# Patient Record
Sex: Male | Born: 1960 | Race: Black or African American | Hispanic: No | Marital: Married | Smoking: Current every day smoker
Health system: Southern US, Community
[De-identification: ages and names within clinical notes are randomized; demographics above are authoritative.]

## PROBLEM LIST (undated history)

## (undated) DIAGNOSIS — E0590 Thyrotoxicosis, unspecified without thyrotoxic crisis or storm: Secondary | ICD-10-CM

## (undated) DIAGNOSIS — F17200 Nicotine dependence, unspecified, uncomplicated: Secondary | ICD-10-CM

## (undated) DIAGNOSIS — Z5189 Encounter for other specified aftercare: Secondary | ICD-10-CM

## (undated) DIAGNOSIS — R5383 Other fatigue: Secondary | ICD-10-CM

## (undated) DIAGNOSIS — I10 Essential (primary) hypertension: Secondary | ICD-10-CM

## (undated) DIAGNOSIS — M549 Dorsalgia, unspecified: Secondary | ICD-10-CM

## (undated) DIAGNOSIS — R809 Proteinuria, unspecified: Secondary | ICD-10-CM

## (undated) DIAGNOSIS — N529 Male erectile dysfunction, unspecified: Secondary | ICD-10-CM

## (undated) DIAGNOSIS — E785 Hyperlipidemia, unspecified: Secondary | ICD-10-CM

## (undated) DIAGNOSIS — N200 Calculus of kidney: Secondary | ICD-10-CM

## (undated) DIAGNOSIS — E039 Hypothyroidism, unspecified: Secondary | ICD-10-CM

## (undated) DIAGNOSIS — R5381 Other malaise: Secondary | ICD-10-CM

## (undated) DIAGNOSIS — E0500 Thyrotoxicosis with diffuse goiter without thyrotoxic crisis or storm: Secondary | ICD-10-CM

## (undated) HISTORY — DX: Dorsalgia, unspecified: M54.9

## (undated) HISTORY — DX: Encounter for other specified aftercare: Z51.89

## (undated) HISTORY — DX: Thyrotoxicosis with diffuse goiter without thyrotoxic crisis or storm: E05.00

## (undated) HISTORY — DX: Proteinuria, unspecified: R80.9

## (undated) HISTORY — DX: Essential (primary) hypertension: I10

## (undated) HISTORY — DX: Male erectile dysfunction, unspecified: N52.9

## (undated) HISTORY — DX: Nicotine dependence, unspecified, uncomplicated: F17.200

## (undated) HISTORY — PX: OTHER SURGICAL HISTORY: SHX169

## (undated) HISTORY — DX: Other fatigue: R53.83

## (undated) HISTORY — DX: Thyrotoxicosis, unspecified without thyrotoxic crisis or storm: E05.90

## (undated) HISTORY — DX: Hypothyroidism, unspecified: E03.9

## (undated) HISTORY — DX: Other malaise: R53.81

## (undated) HISTORY — DX: Calculus of kidney: N20.0

## (undated) HISTORY — DX: Hyperlipidemia, unspecified: E78.5

---

## 1991-04-25 DIAGNOSIS — Z5189 Encounter for other specified aftercare: Secondary | ICD-10-CM

## 1991-04-25 HISTORY — PX: SMALL INTESTINE SURGERY: SHX150

## 1991-04-25 HISTORY — PX: AV FISTULA REPAIR: SHX563

## 1991-04-25 HISTORY — PX: HEMORRHOID SURGERY: SHX153

## 1991-04-25 HISTORY — DX: Encounter for other specified aftercare: Z51.89

## 1992-08-20 LAB — HM COLONOSCOPY: HM Colonoscopy: NORMAL

## 1999-05-15 ENCOUNTER — Emergency Department (HOSPITAL_COMMUNITY): Admission: EM | Admit: 1999-05-15 | Discharge: 1999-05-15 | Payer: Self-pay | Admitting: Emergency Medicine

## 1999-05-16 ENCOUNTER — Encounter: Payer: Self-pay | Admitting: Emergency Medicine

## 1999-05-19 ENCOUNTER — Encounter: Payer: Self-pay | Admitting: Urology

## 1999-05-19 ENCOUNTER — Ambulatory Visit (HOSPITAL_COMMUNITY): Admission: RE | Admit: 1999-05-19 | Discharge: 1999-05-19 | Payer: Self-pay | Admitting: Urology

## 2003-12-10 ENCOUNTER — Ambulatory Visit (HOSPITAL_COMMUNITY): Admission: RE | Admit: 2003-12-10 | Discharge: 2003-12-10 | Payer: Self-pay | Admitting: Internal Medicine

## 2004-04-28 ENCOUNTER — Emergency Department (HOSPITAL_COMMUNITY): Admission: EM | Admit: 2004-04-28 | Discharge: 2004-04-29 | Payer: Self-pay | Admitting: Emergency Medicine

## 2004-05-09 ENCOUNTER — Emergency Department (HOSPITAL_COMMUNITY): Admission: EM | Admit: 2004-05-09 | Discharge: 2004-05-09 | Payer: Self-pay | Admitting: Emergency Medicine

## 2004-05-20 ENCOUNTER — Ambulatory Visit (HOSPITAL_BASED_OUTPATIENT_CLINIC_OR_DEPARTMENT_OTHER): Admission: RE | Admit: 2004-05-20 | Discharge: 2004-05-20 | Payer: Self-pay | Admitting: Urology

## 2004-05-20 ENCOUNTER — Ambulatory Visit (HOSPITAL_COMMUNITY): Admission: RE | Admit: 2004-05-20 | Discharge: 2004-05-20 | Payer: Self-pay | Admitting: Urology

## 2004-05-23 ENCOUNTER — Ambulatory Visit (HOSPITAL_COMMUNITY): Admission: RE | Admit: 2004-05-23 | Discharge: 2004-05-23 | Payer: Self-pay | Admitting: Urology

## 2008-04-22 ENCOUNTER — Emergency Department (HOSPITAL_COMMUNITY): Admission: EM | Admit: 2008-04-22 | Discharge: 2008-04-22 | Payer: Self-pay | Admitting: Emergency Medicine

## 2008-04-23 ENCOUNTER — Ambulatory Visit: Payer: Self-pay | Admitting: Family Medicine

## 2008-04-23 DIAGNOSIS — M549 Dorsalgia, unspecified: Secondary | ICD-10-CM

## 2008-04-23 DIAGNOSIS — F17200 Nicotine dependence, unspecified, uncomplicated: Secondary | ICD-10-CM

## 2008-04-23 DIAGNOSIS — I10 Essential (primary) hypertension: Secondary | ICD-10-CM

## 2008-04-23 HISTORY — DX: Essential (primary) hypertension: I10

## 2008-04-23 HISTORY — DX: Nicotine dependence, unspecified, uncomplicated: F17.200

## 2008-04-23 HISTORY — DX: Dorsalgia, unspecified: M54.9

## 2008-05-28 ENCOUNTER — Ambulatory Visit: Payer: Self-pay | Admitting: Family Medicine

## 2008-06-29 ENCOUNTER — Ambulatory Visit: Payer: Self-pay | Admitting: Family Medicine

## 2008-07-02 LAB — CONVERTED CEMR LAB
AST: 35 units/L (ref 0–37)
Albumin: 4.3 g/dL (ref 3.5–5.2)
Basophils Absolute: 0.1 10*3/uL (ref 0.0–0.1)
Bilirubin, Direct: 0.1 mg/dL (ref 0.0–0.3)
CO2: 26 meq/L (ref 19–32)
Cholesterol: 270 mg/dL (ref 0–200)
GFR calc Af Amer: 92 mL/min
Glucose, Bld: 70 mg/dL (ref 70–99)
HCT: 45.9 % (ref 39.0–52.0)
Hemoglobin: 15.4 g/dL (ref 13.0–17.0)
Lymphocytes Relative: 41.1 % (ref 12.0–46.0)
Monocytes Relative: 8.5 % (ref 3.0–12.0)
Neutro Abs: 2.7 10*3/uL (ref 1.4–7.7)
Neutrophils Relative %: 48.1 % (ref 43.0–77.0)
PSA: 0.69 ng/mL (ref 0.10–4.00)
Platelets: 167 10*3/uL (ref 150–400)
Potassium: 4.4 meq/L (ref 3.5–5.1)
RDW: 14 % (ref 11.5–14.6)
Total CHOL/HDL Ratio: 3.6
Triglycerides: 73 mg/dL (ref 0–149)

## 2008-07-03 ENCOUNTER — Ambulatory Visit: Payer: Self-pay | Admitting: Family Medicine

## 2008-07-05 DIAGNOSIS — E785 Hyperlipidemia, unspecified: Secondary | ICD-10-CM

## 2008-07-05 HISTORY — DX: Hyperlipidemia, unspecified: E78.5

## 2009-01-15 ENCOUNTER — Ambulatory Visit: Payer: Self-pay | Admitting: Family Medicine

## 2009-01-17 LAB — CONVERTED CEMR LAB
Cholesterol: 230 mg/dL — ABNORMAL HIGH (ref 0–200)
Total CHOL/HDL Ratio: 3

## 2009-01-25 ENCOUNTER — Ambulatory Visit: Payer: Self-pay | Admitting: Family Medicine

## 2009-01-25 DIAGNOSIS — R809 Proteinuria, unspecified: Secondary | ICD-10-CM | POA: Insufficient documentation

## 2009-01-25 HISTORY — DX: Proteinuria, unspecified: R80.9

## 2009-01-27 LAB — CONVERTED CEMR LAB
ALT: 24 units/L (ref 0–53)
AST: 28 units/L (ref 0–37)
Albumin: 4.2 g/dL (ref 3.5–5.2)
BUN: 13 mg/dL (ref 6–23)
Bilirubin, Direct: 0.1 mg/dL (ref 0.0–0.3)
Chloride: 104 meq/L (ref 96–112)
Creatinine, Ser: 1.1 mg/dL (ref 0.4–1.5)
Creatinine,U: 227.1 mg/dL
GFR calc non Af Amer: 91.49 mL/min (ref >60)
Microalb Creat Ratio: 2.6 mg/g (ref 0.0–30.0)
Potassium: 3.7 meq/L (ref 3.5–5.1)
Total Bilirubin: 0.8 mg/dL (ref 0.3–1.2)
Total Protein: 7.5 g/dL (ref 6.0–8.3)

## 2009-06-29 ENCOUNTER — Ambulatory Visit: Payer: Self-pay | Admitting: Family Medicine

## 2009-06-29 DIAGNOSIS — R5381 Other malaise: Secondary | ICD-10-CM

## 2009-06-29 DIAGNOSIS — R5383 Other fatigue: Secondary | ICD-10-CM

## 2009-06-29 HISTORY — DX: Other malaise: R53.81

## 2009-06-29 LAB — CONVERTED CEMR LAB
AST: 29 units/L (ref 0–37)
Albumin: 3.9 g/dL (ref 3.5–5.2)
Bilirubin, Direct: 0.1 mg/dL (ref 0.0–0.3)
Calcium: 9.6 mg/dL (ref 8.4–10.5)
Cholesterol: 208 mg/dL — ABNORMAL HIGH (ref 0–200)
Creatinine, Ser: 0.9 mg/dL (ref 0.4–1.5)
Direct LDL: 125.9 mg/dL
GFR calc non Af Amer: 115.13 mL/min (ref >60)
HDL: 77.1 mg/dL (ref >39.00)
Lymphs Abs: 2.3 10*3/uL (ref 0.7–4.0)
MCHC: 32 g/dL (ref 30.0–36.0)
MCV: 85.4 fL (ref 78.0–100.0)
Neutrophils Relative %: 35.3 % — ABNORMAL LOW (ref 43.0–77.0)
RDW: 13.5 % (ref 11.5–14.6)
TSH: 0.03 microintl units/mL — ABNORMAL LOW (ref 0.35–5.50)
Total Bilirubin: 0.4 mg/dL (ref 0.3–1.2)
Total Protein: 7.8 g/dL (ref 6.0–8.3)
Triglycerides: 71 mg/dL (ref 0.0–149.0)
VLDL: 14.2 mg/dL (ref 0.0–40.0)
WBC: 4.8 10*3/uL (ref 4.5–10.5)

## 2009-07-27 ENCOUNTER — Ambulatory Visit: Payer: Self-pay | Admitting: Family Medicine

## 2009-07-27 DIAGNOSIS — E0590 Thyrotoxicosis, unspecified without thyrotoxic crisis or storm: Secondary | ICD-10-CM

## 2009-07-27 HISTORY — DX: Thyrotoxicosis, unspecified without thyrotoxic crisis or storm: E05.90

## 2009-07-28 DIAGNOSIS — E0500 Thyrotoxicosis with diffuse goiter without thyrotoxic crisis or storm: Secondary | ICD-10-CM | POA: Insufficient documentation

## 2009-07-28 HISTORY — DX: Thyrotoxicosis with diffuse goiter without thyrotoxic crisis or storm: E05.00

## 2009-07-29 LAB — CONVERTED CEMR LAB: T3, Free: 4.2 pg/mL (ref 2.3–4.2)

## 2009-08-23 ENCOUNTER — Ambulatory Visit: Payer: Self-pay | Admitting: Endocrinology

## 2010-02-09 ENCOUNTER — Telehealth (INDEPENDENT_AMBULATORY_CARE_PROVIDER_SITE_OTHER): Payer: Self-pay | Admitting: *Deleted

## 2010-03-21 ENCOUNTER — Ambulatory Visit: Payer: Self-pay | Admitting: Endocrinology

## 2010-03-21 LAB — CONVERTED CEMR LAB: TSH: 0.04 microintl units/mL — ABNORMAL LOW (ref 0.35–5.50)

## 2010-04-05 ENCOUNTER — Encounter (HOSPITAL_COMMUNITY)
Admission: RE | Admit: 2010-04-05 | Discharge: 2010-04-22 | Payer: Self-pay | Source: Home / Self Care | Attending: Endocrinology | Admitting: Endocrinology

## 2010-04-28 ENCOUNTER — Ambulatory Visit (HOSPITAL_COMMUNITY)
Admission: RE | Admit: 2010-04-28 | Discharge: 2010-04-28 | Payer: Self-pay | Source: Home / Self Care | Attending: Endocrinology | Admitting: Endocrinology

## 2010-05-24 NOTE — Progress Notes (Signed)
Summary: Endocrinlogist referral scheduled   Phone Note Call from Patient   Caller: Patient Call For: Hannah Beat MD Summary of Call: Called pt to discuss Endocrinlogist referral .  Cancelled the appt w/ Dr. Everardo All because wife was sick. Pt says he is feeling good and not sure if he needs to keep appt. Please advised.Marland KitchenMarland KitchenAram Beecham  Initial call taken by: Daine Gip,  February 09, 2010 3:46 PM  Follow-up for Phone Call        I still recommend Endocrine follow-up. His TSH was markedly low and has a daughter with Graves disease. That would be my recommendation.  Hannah Beat MD  February 11, 2010 9:14 AM   Additional Follow-up for Phone Call Additional follow up Details #1::        Called pt per Dr.Copland  left message.Daine Gip  February 25, 2010 10:19 AM     Additional Follow-up for Phone Call Additional follow up Details #2::    Pt appt w/ the Endocrinology has been scheduled for Nov 28th at 2:30pm. Pt aware of appt date and time, also he needs to arrive early.Daine Gip  March 15, 2010 10:20 AM  Follow-up by: Daine Gip,  March 15, 2010 10:20 AM  Additional Follow-up for Phone Call Additional follow up Details #3:: Details for Additional Follow-up Action Taken: Pt went to appt.Marland KitchenMarland KitchenDaine Gip  March 22, 2010 1:50 PM  Additional Follow-up by: Daine Gip,  March 22, 2010 1:50 PM

## 2010-05-24 NOTE — Assessment & Plan Note (Signed)
Summary: NEW [employer] -- GRAVES--ENDO PT--#-PER CYNTHIA/DR COPLAND-[facility]   Vital Signs:  Patient profile:   50 year old male Height:      72 inches (182.88 cm) Weight:      168.25 pounds (76.48 kg) BMI:     22.90 O2 Sat:      99 % on Room air Temp:     99.1 degrees F (37.28 degrees C) oral Pulse rate:   70 / minute Pulse rhythm:   regular BP sitting:   112 / 78  (left arm) Cuff size:   regular  Vitals Entered By: Brenton Grills CMA Duncan Dull) (March 21, 2010 2:48 PM)  O2 Flow:  Room air CC: New Endo Consult/Graves Disease/Dr. Copland Is Patient Diabetic? No   Referring Provider:  Hannah Beat MD Primary Provider:  Hannah Beat MD  CC:  New Endo Consult/Graves Disease/Dr. Patsy Lager.  History of Present Illness: pt was noted on routine labs to have suppressed tsh.  he says, in retrospect, he has few weeks of mild headaches, worst at the bifrontal areas, and associated slight weight loss.    Current Medications (verified): 1)  Hydrochlorothiazide 25 Mg  Tabs (Hydrochlorothiazide) .... Take 1 Tab By Mouth Every Morning 2)  Fish Oil   Oil (Fish Oil) .... Once Daily 3)  Multivitamins   Tabs (Multiple Vitamin) .... Take 1 Tablet By Mouth Once A Day 4)  Pravastatin Sodium 40 Mg Tabs (Pravastatin Sodium) .Marland Kitchen.. 1 By Mouth At Bedtime  Allergies (verified): No Known Drug Allergies  Past History:  Past Medical History: Last updated: 07/03/2008 HTN Hyperlipidemia Tobacco abuse  Family History: Reviewed history from 07/03/2008 and no changes required. dtr has grave's dz  Social History: Reviewed history from 07/03/2008 and no changes required. 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  he has slight doe.  he has slight acral tingling.    Physical Exam  General:  normal  appearance.   Head:  head: no deformity external nose and ears are normal mouth: no lesion seen Eyes:  there is bilat proptosis Neck:  thyroid is slightly enlarged on the right lobe, and normal on the left.  no nodule Lungs:  Clear to auscultation bilaterally. Normal respiratory effort.  Heart:  Regular rate and rhythm without murmurs or gallops noted. Normal S1,S2.   Abdomen:  abdomen is soft, nontender.  no hepatosplenomegaly.   not distended.  no hernia Msk:  muscle bulk and strength are grossly normal.  no obvious joint swelling.  gait is normal and steady  Pulses:  dorsalis pedis intact bilat.  no carotid bruit Extremities:  no deformity.  no ulcer on the feet.  feet are of normal color and temp.  no edema  Neurologic:  cn 2-12 grossly intact.   readily moves all 4's.   sensation is intact to touch on the feet  Skin:  normal texture and temp.  no rash.  not diaphoretic  Cervical Nodes:  No significant adenopathy.  Psych:  Alert and cooperative; normal mood and affect; normal attention span and concentration.   Additional Exam:  FastTSH              [L]  0.04 uIU/mL                 0.35-5.50 Free T4  1.60 ng/dL     Impression & Recommendations:  Problem # 1:  GRAVES' DISEASE (ICD-242.00) we discussed the cause (hereditary), adverse effects, and rx options  Problem # 2:  HYPERTHYROIDISM (ICD-242.90) due to #1  Problem # 3:  headache possibly due to #1  Other Orders: i-131 Thyroid uptake and scan (Capsule & Scan) Nuc Med Diagnostic (thyroid uptake & sca) TLB-TSH (Thyroid Stimulating Hormone) (84443-TSH) TLB-T4 (Thyrox), Free (44010-UV2Z) Consultation Level IV (36644)  Patient Instructions: 1)  blood tests are being ordered for you today.  please call [phone] to hear your test results. 2)  basad on the results, i would probably order a "scan" (a special, but easy and painless type of thyroid x ray) 3)  (update: i left message on phone-tree:  i ordered  scan)   Orders Added: 1)  i-131 Thyroid uptake and scan (Capsule & Scan) Nuc Med Diagnostic [thyroid uptake & sca] 2)  TLB-TSH (Thyroid Stimulating Hormone) [84443-TSH] 3)  TLB-T4 (Thyrox), Free [95638-VF6E] 4)  Consultation Level IV [33295]

## 2010-05-24 NOTE — Assessment & Plan Note (Signed)
Summary: CPX / LFW   Vital Signs:  Patient profile:   50 year old male Height:      72 inches Weight:      173.6 pounds BMI:     23.63 Temp:     98.1 degrees F oral Pulse rate:   72 / minute Pulse rhythm:   regular BP sitting:   122 / 90  (left arm) Cuff size:   regular  Vitals Entered By: Benny Lennert CMA Duncan Dull) (July 27, 2009 2:34 PM)  History of Present Illness: Chief complaint cpx  50 year old:  CPX  Low TSH, asymptomatic. Daughter with Graves disease. Does have prominent eyes. Denies any symptoms currently.  Preventive Screening-Counseling & Management  Alcohol-Tobacco     Alcohol drinks/day: <1     Alcohol type: brandy     >5/day in last 3 mos: yes     Alcohol Counseling: to decrease amount and/or frequency of alcohol intake     Smoking Status: current     Smoking Cessation Counseling: yes     Smoke Cessation Stage: contemplative     Packs/Day: 0.75     Pack years: 30     Tobacco Counseling: to quit use of tobacco products  Caffeine-Diet-Exercise     Diet Counseling: not indicated; diet is assessed to be healthy  Hep-HIV-STD-Contraception     STD Risk: no risk noted     Dental Care Counseling: not indicated; dental care within six months     Testicular SE Education/Counseling to perform regular STE      Sexual History:  currently monogamous.        Drug Use:  never.     Allergies (verified): No Known Drug Allergies  Past History:  Past medical, surgical, family and social histories (including risk factors) reviewed, and no changes noted (except as noted below).  Past Medical History: Reviewed history from 07/03/2008 and no changes required. HTN Hyperlipidemia Tobacco abuse  Past Surgical History: Reviewed history from 07/03/2008 and no changes required. remote bowel surgeries > 15 years ago   Family History: Reviewed history from 07/03/2008 and no changes required. n/c  Social History: Reviewed history from 07/03/2008 and no  changes required. 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  Cardiovascular: Denies chest pains, palpitations, syncope, dyspnea on exertion,  Respiratory: Denies cough, dyspnea at rest, excessive sputum,wheeezing GI: Denies nausea, vomiting, diarrhea, constipation, change in bowel habits, abdominal pain, melena, hematochezia GU: Denies dysuria, hematuria, discharge, urinary frequency, urinary hesitancy, nocturia, incontinence, genital sores, decreased libido Musculoskeletal: Denies back pain, joint pain Derm: Denies rash, itching Neuro: Denies  paresthesias, frequent falls, frequent headaches, and difficulty walking.  Psych: WIFE IN THE HOSPITAL WITH LIVER FAILURE Endocrine: Denies cold intolerance, heat intolerance, polydipsia, polyphagia, polyuria, and unusual weight change.  Heme: Denies enlarged lymph nodes Allergy: No hayfever   Otherwise, the pertinent positives and negatives are listed above and in the HPI, otherwise a full review of systems has been reviewed and is negative unless noted positive.    Impression & Recommendations:  Problem # 1:  HEALTH MAINTENANCE EXAM (ICD-V70.0) The patient's preventative maintenance and recommended screening tests for an annual wellness exam were reviewed in full today. Brought up to date unless services declined.  Counselled on the importance of diet, exercise, and its role in overall health and mortality. The patient's FH and SH was reviewed, including their home life, tobacco status, and drug and alcohol status.   Problem # 2:  HYPERTHYROIDISM (ICD-242.90) Assessment: New  TSH really low check active hormone levels  if quite high,  endocrine input may be helpful  Orders: Venipuncture (52841) TLB-T3, Free (Triiodothyronine) (84481-T3FREE) TLB-T4 (Thyrox), Total (84436-T4) TLB-T4 (Thyrox), Free [phone])  Labs Reviewed: TSH: 0.03 (06/29/2009)     Complete Medication List: 1)  Hydrochlorothiazide 25 Mg Tabs (Hydrochlorothiazide) .... Take 1 tab by mouth every morning 2)  Fish Oil Oil (Fish oil) .... Once daily 3)  Multivitamins Tabs (Multiple vitamin) .... Take 1 tablet by mouth once a day 4)  Pravastatin Sodium 40 Mg Tabs (Pravastatin sodium) .Marland Kitchen.. 1 by mouth at bedtime  Patient Instructions: 1)  CPX 1 year or if problems  Current Allergies (reviewed today): No known allergies      Other Screening   PSA: 0.72  (06/29/2009)   PSA action/deferral: Discussed-PSA requested  (07/27/2009)   PSA due due: 06/29/2009   Smoking status: current  (07/27/2009)   Smoking cessation counseling: yes  (07/27/2009)    Screening comments: declined DRE  Lipids   Total Cholesterol: 208  (06/29/2009)   LDL: DEL  (06/29/2008)   LDL Direct: 125.9  (06/29/2009)   HDL: 77.10  (06/29/2009)   Triglycerides: 71.0  (06/29/2009)    SGOT (AST): 29  (06/29/2009)   SGPT (ALT): 43  (06/29/2009)   Alkaline phosphatase: 63  (06/29/2009)   Total bilirubin: 0.4  (06/29/2009)    Lipid flowsheet reviewed?: Yes   Progress toward LDL goal: Improved  Hypertension   Last Blood Pressure: 122 / 90  (07/27/2009)   Serum creatinine: 0.9  (06/29/2009)   Serum potassium 4.1  (06/29/2009)    Hypertension flowsheet reviewed?: Yes   Progress toward BP goal: Unchanged   Physical Exam General Appearance: well developed, well nourished, no acute distress Eyes: conjunctiva and lids normal, PERRLA, EOMI Ears, Nose, Mouth, Throat: TM clear, nares clear, oral exam WNL Neck: supple, no lymphadenopathy, no thyromegaly, no JVD Respiratory: clear to auscultation and percussion, respiratory effort normal Cardiovascular: regular rate and  rhythm, S1-S2, no murmur, rub or gallop, no bruits, peripheral pulses normal and symmetric, no cyanosis, clubbing, edema or varicosities Chest: no scars, masses, tenderness; no asymmetry, skin changes, nipple discharge, no gynecomastia   Gastrointestinal: soft, non-tender; no hepatosplenomegaly, masses; active bowel sounds all quadrants,  Genitourinary: no hernia, testicular mass, penile discharge, priapism  Lymphatic: no cervical, axillary or inguinal adenopathy Musculoskeletal: gait normal, muscle tone and strength WNL, no joint swelling, effusions, discoloration, crepitus  Skin: clear, good turgor, color WNL, no rashes, lesions, or ulcerations Neurologic: normal mental status, normal reflexes, normal strength, sensation, and motion Psychiatric: alert; oriented to person, place and time Other Exam:

## 2010-06-03 ENCOUNTER — Ambulatory Visit: Payer: Self-pay | Admitting: Endocrinology

## 2010-06-24 ENCOUNTER — Ambulatory Visit (INDEPENDENT_AMBULATORY_CARE_PROVIDER_SITE_OTHER): Payer: BC Managed Care – PPO | Admitting: Endocrinology

## 2010-06-24 ENCOUNTER — Other Ambulatory Visit: Payer: BC Managed Care – PPO

## 2010-06-24 ENCOUNTER — Other Ambulatory Visit: Payer: Self-pay | Admitting: Endocrinology

## 2010-06-24 ENCOUNTER — Encounter: Payer: Self-pay | Admitting: Endocrinology

## 2010-06-24 DIAGNOSIS — E0590 Thyrotoxicosis, unspecified without thyrotoxic crisis or storm: Secondary | ICD-10-CM

## 2010-06-24 DIAGNOSIS — N529 Male erectile dysfunction, unspecified: Secondary | ICD-10-CM

## 2010-06-24 HISTORY — DX: Male erectile dysfunction, unspecified: N52.9

## 2010-06-24 LAB — TSH: TSH: 0.08 u[IU]/mL — ABNORMAL LOW (ref 0.35–5.50)

## 2010-06-24 LAB — T4, FREE: Free T4: 1.26 ng/dL (ref 0.60–1.60)

## 2010-07-05 NOTE — Assessment & Plan Note (Signed)
Summary: fu-lb   Vital Signs:  Patient profile:   50 year old male Height:      72 inches (182.88 cm) Weight:      176.38 pounds (80.17 kg) BMI:     24.01 O2 Sat:      97 % on Room air Temp:     99.1 degrees F (37.28 degrees C) oral Pulse rate:   88 / minute Pulse rhythm:   regular BP sitting:   128 / 88  (left arm) Cuff size:   regular  Vitals Entered By: Brenton Grills CMA Duncan Dull) (June 24, 2010 4:28 PM)  O2 Flow:  Room air CC: Follow-up on thyroid/aj Is Patient Diabetic? No   Referring Provider:  Hannah Beat MD Primary Provider:  Hannah Beat MD  CC:  Follow-up on thyroid/aj.  History of Present Illness: pt is now 2 mos s/o i-131 rx for hyperthyroidism due to grave's dz.   he report erectile dysfunction recently also.    Current Medications (verified): 1)  Hydrochlorothiazide 25 Mg  Tabs (Hydrochlorothiazide) .... Take 1 Tab By Mouth Every Morning 2)  Fish Oil   Oil (Fish Oil) .... Once Daily 3)  Multivitamins   Tabs (Multiple Vitamin) .... Take 1 Tablet By Mouth Once A Day 4)  Pravastatin Sodium 40 Mg Tabs (Pravastatin Sodium) .Marland Kitchen.. 1 By Mouth At Bedtime  Allergies (verified): No Known Drug Allergies  Past History:  Past Medical History: Last updated: 07/03/2008 HTN Hyperlipidemia Tobacco abuse  Past Surgical History: remote bowel surgeries > 15 years ago relocation of left undescended left testicle at age 50  Review of Systems       he has gained 8 lbs  Physical Exam  General:  normal appearance.   Neck:  thyroid is slightly enlarged on the right lobe, and normal on the left.  no nodule Genitalia:  both testes are small and soft.  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 1/2 tab as needed  Other Orders: TLB-TSH (Thyroid Stimulating Hormone) (84443-TSH) TLB-Testosterone, Total (84403-TESTO) TLB-T4 (Thyrox), Free [phone]) Est. Patient Level IV (46962) Est. Patient Level III (95284)  Patient Instructions: 1)  blood tests are being ordered for you today.  please call [phone] to hear your test results. 2)  Please schedule a follow-up appointment in 1 month. 3)  try levitra 1/2 of 10 mg as needed. Prescriptions: LEVITRA 20 MG TABS (VARDENAFIL HCL) 1/2 tab as needed  #2 x 5   Entered and Authorized by:   Minus Breeding MD   Signed by:  Minus Breeding MD on 06/24/2010   Method used:   [facility]       [address]       [place], [place]  [address]       Ph: [phone]       Fax: [phone]   RxID:   [id number]    Orders Added: 1)  TLB-TSH (Thyroid Stimulating Hormone) [84443-TSH] 2)  TLB-Testosterone, Total [84403-TESTO] 3)  TLB-T4 (Thyrox), Free [84132-GM0N] 4)  Est. Patient Level IV [02725] 5)  Est. Patient Level III [36644]

## 2010-09-09 NOTE — Op Note (Signed)
NAMEANSLEY, STANWOOD               ACCOUNT NO.:  [id number]   MEDICAL RECORD NO.:  [id number]          PATIENT TYPE:  AMB   LOCATION:  [facility]                         FACILITY:  [facility]   PHYSICIAN:  Rozanna Boer., M.D.DATE OF BIRTH:  Jun 10, 1960   DATE OF PROCEDURE:  05/20/2004  DATE OF DISCHARGE:                                 OPERATIVE REPORT   PREOPERATIVE DIAGNOSIS:  Left ureteral obstruction by 7 mm mid ureteral  stone.   POSTOPERATIVE DIAGNOSIS:  Left ureteral obstruction by 7 mm mid ureteral  stone.   OPERATIONS:  1.  Cysto.  2.  Retrograde pyelogram, stone manipulation with insertion of left ureteral      stent.   ANESTHESIA:  General.   SURGEON:  Dr. 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  He was seen in the  office 2 days ago, with a CT scan showing an obstructing 7 mm stone in the  upper to mid ureter on the left side with marked hydronephrosis.  I could  not see this well enough on x-ray to be able to do lithotripsy. so he came  back today having to take Percocet every 3-4 hours just to relieve the pain.  The stone was now visible on KUB and because of the need to require so much  in the way of pain medicine, he enters now for cysto, stone manipulation,  insertion of stent prior to anticipated lithotripsy on May 23, 2004.  He  had had lithotripsy twice before in the past.   The patient was placed on the operating table in dorsal lithotomy position.  After satisfactory induction of general anesthesia, was prepped and draped  in the usual sterile fashion.  Panendoscope was passed.  No anterior  stricture seen.  Posterior urethra was nonobstructing.  The bladder was  entered.  Bladder was free of any mucosal lesions.  Trigone and orifices  looked normal.  The left orifice was  catheterized with a floppy-tip 0.038  guidewire which was passed up under fluoroscopy to the level of the stone,  but I could not get this to go past the stone.  I then passed a 6 open-ended  ureteral catheter up to the stone and then removed the floppy-tip guidewire  passed a Glidewire under fluoroscopy past the stone.  I was then able to  pass the open-ended catheter beyond the stone and got a hydronephrotic drip  from the kidney and removed the Glidewire.  I reinserted the guide wire and  pulled out the open-ended catheter and then over this, passed a 6-French x  26 cm length double-J ureteral stent without difficulty.  When the guidewire  was removed, he had a nice coil in the renal pelvis and one in the bladder  which was then drained, scope removed, the patient given some Toradol IV.  Since it is more than 48 hours before his lithotripsy and  was sent to the  recovery room in good condition to be later discharged as an outpatient.     HMK/MEDQ  D:  05/20/2004  T:  05/20/2004  Job:  [id number]

## 2010-12-27 ENCOUNTER — Ambulatory Visit (INDEPENDENT_AMBULATORY_CARE_PROVIDER_SITE_OTHER): Payer: BC Managed Care – PPO | Admitting: Endocrinology

## 2010-12-27 ENCOUNTER — Encounter: Payer: Self-pay | Admitting: Endocrinology

## 2010-12-27 ENCOUNTER — Telehealth: Payer: Self-pay | Admitting: Endocrinology

## 2010-12-27 ENCOUNTER — Other Ambulatory Visit (INDEPENDENT_AMBULATORY_CARE_PROVIDER_SITE_OTHER): Payer: BC Managed Care – PPO

## 2010-12-27 VITALS — BP 128/84 | HR 86 | Temp 98.9°F | Ht 72.0 in | Wt 177.2 lb

## 2010-12-27 DIAGNOSIS — E0500 Thyrotoxicosis with diffuse goiter without thyrotoxic crisis or storm: Secondary | ICD-10-CM

## 2010-12-27 LAB — TSH: TSH: 38.27 u[IU]/mL — ABNORMAL HIGH (ref 0.35–5.50)

## 2010-12-27 LAB — T4, FREE: Free T4: 0.11 ng/dL — ABNORMAL LOW (ref 0.60–1.60)

## 2010-12-27 MED ORDER — LEVOTHYROXINE SODIUM 125 MCG PO TABS: 125.0000 ug | ORAL_TABLET | Freq: Every day | ORAL | Status: DC

## 2010-12-27 NOTE — Patient Instructions (Addendum)
blood tests are being requested for you today.  please call [phone] to hear your test results.  You will be prompted to enter the 9-digit "MRN" number that appears at the top left of this page, followed by #.  Then you will hear the message. Please make a follow-up appointment in 2 months.

## 2010-12-27 NOTE — Telephone Encounter (Signed)
i left message on phone tree Start synthroid 125/d Ret as sched

## 2010-12-27 NOTE — Progress Notes (Signed)
  Subjective:    Patient ID: Fernando Bell, male    DOB: Oct 11, 1960, 50 y.o.   MRN: [id number]  HPI Pt is 8 mos s/p i-131 rx for hyperthyroidism, due to grave's dz.pt says his deep voice has been present x many years.   Past Medical History  Diagnosis Date  . GRAVES' DISEASE 07/28/2009  . HYPERTHYROIDISM 07/27/2009  . HYPERLIPIDEMIA 07/05/2008  . TOBACCO ABUSE 04/23/2008  . HYPERTENSION 04/23/2008  . BACK PAIN 04/23/2008  . Other malaise and fatigue 06/29/2009  . Proteinuria 01/25/2009  . 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 Relocation of left undescended testicle at age 33   . Small intestine surgery     remote bowel surgeries >15 years ago    History   Social History  . Marital Status: Married    Spouse Name: N/A    Number of Children: N/A  . Years of Education: N/A   Occupational History  . [employer] doctor    Social History Main Topics  . Smoking status: Current Everyday Smoker  . Smokeless tobacco: Not on file  . Alcohol Use: Not on file  . Drug Use: No  . Sexually Active: Not on file   Other Topics Concern  . Not on file   Social History Narrative  . No narrative on file    Current Outpatient Prescriptions on File Prior to Visit  Medication Sig Dispense Refill  . hydrochlorothiazide 25 MG tablet Take 25 mg by mouth every morning.        . pravastatin (PRAVACHOL) 40 MG tablet Take 40 mg by mouth daily.        . Fish Oil OIL Once daily       . Multiple Vitamin (MULTIVITAMIN) tablet Take 1 tablet by mouth daily.        . vardenafil (LEVITRA) 20 MG tablet Take 10 mg by mouth as needed.          Allergies  Allergen Reactions  . Iodine     Family History  Problem Relation Age of Onset  . Graves' disease Daughter     BP 128/84  Pulse 86  Temp(Src) 98.9 F (37.2 C) (Oral)  Ht 6' (1.829 m)  Wt 177 lb 3.2 oz (80.377 kg)  BMI 24.03 kg/m2  SpO2 99%    Review of Systems Denies weight change.  He has a few  muscle cramps     Objective:   Physical Exam VITAL SIGNS:  See vs page GENERAL: no distress Eyes:  There is slight bilat proptosis, and periorbital swelling.       Assessment & Plan:  S/p i-131 rx for hyperthyroidism due to grave's dz, prob hypothyroid now.

## 2011-01-27 LAB — URINALYSIS, ROUTINE W REFLEX MICROSCOPIC
Nitrite: NEGATIVE
Protein, ur: NEGATIVE mg/dL
Urobilinogen, UA: 1 mg/dL (ref 0.0–1.0)

## 2011-01-27 LAB — DIFFERENTIAL
Basophils Absolute: 0 10*3/uL (ref 0.0–0.1)
Eosinophils Absolute: 0.1 10*3/uL (ref 0.0–0.7)
Lymphocytes Relative: 45 % (ref 12–46)
Lymphs Abs: 1.9 10*3/uL (ref 0.7–4.0)
Monocytes Relative: 14 % — ABNORMAL HIGH (ref 3–12)

## 2011-01-27 LAB — COMPREHENSIVE METABOLIC PANEL
AST: 33 U/L (ref 0–37)
Albumin: 3.9 g/dL (ref 3.5–5.2)
Alkaline Phosphatase: 70 U/L (ref 39–117)
BUN: 9 mg/dL (ref 6–23)
CO2: 24 mEq/L (ref 19–32)
Chloride: 97 mEq/L (ref 96–112)
Creatinine, Ser: 0.92 mg/dL (ref 0.4–1.5)
Sodium: 129 mEq/L — ABNORMAL LOW (ref 135–145)
Total Bilirubin: 1.1 mg/dL (ref 0.3–1.2)
Total Protein: 7.1 g/dL (ref 6.0–8.3)

## 2011-01-27 LAB — CBC
HCT: 46.6 % (ref 39.0–52.0)
Hemoglobin: 15.5 g/dL (ref 13.0–17.0)
Platelets: 184 10*3/uL (ref 150–400)
WBC: 4.3 10*3/uL (ref 4.0–10.5)

## 2011-02-20 ENCOUNTER — Other Ambulatory Visit: Payer: Self-pay | Admitting: *Deleted

## 2011-02-20 MED ORDER — HYDROCHLOROTHIAZIDE 25 MG PO TABS: 25.0000 mg | ORAL_TABLET | ORAL | Status: DC

## 2011-02-27 ENCOUNTER — Encounter: Payer: Self-pay | Admitting: Endocrinology

## 2011-02-27 ENCOUNTER — Ambulatory Visit (INDEPENDENT_AMBULATORY_CARE_PROVIDER_SITE_OTHER): Payer: BC Managed Care – PPO | Admitting: Endocrinology

## 2011-02-27 ENCOUNTER — Other Ambulatory Visit (INDEPENDENT_AMBULATORY_CARE_PROVIDER_SITE_OTHER): Payer: BC Managed Care – PPO

## 2011-02-27 VITALS — BP 152/102 | HR 61 | Temp 98.1°F | Ht 72.0 in | Wt 177.8 lb

## 2011-02-27 DIAGNOSIS — E890 Postprocedural hypothyroidism: Secondary | ICD-10-CM

## 2011-02-27 LAB — TSH: TSH: 1.78 u[IU]/mL (ref 0.35–5.50)

## 2011-02-27 NOTE — Progress Notes (Signed)
  Subjective:    Patient ID: Fernando Bell, male    DOB: 12/06/1960, 50 y.o.   MRN: [id number]  HPI Pt is 10 mos s/p i-131 rx for hyperthyroidism, due to grave's dz.  He feels much better since he has been on synthroid.  He says his deep voice is lifelong.   Past Medical History  Diagnosis Date  . GRAVES' DISEASE 07/28/2009  . HYPERTHYROIDISM 07/27/2009  . HYPERLIPIDEMIA 07/05/2008  . TOBACCO ABUSE 04/23/2008  . HYPERTENSION 04/23/2008  . BACK PAIN 04/23/2008  . Other malaise and fatigue 06/29/2009  . Proteinuria 01/25/2009  . 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 Relocation of left undescended testicle at age 16   . Small intestine surgery     remote bowel surgeries >15 years ago    History   Social History  . Marital Status: Married    Spouse Name: N/A    Number of Children: N/A  . Years of Education: N/A   Occupational History  . [employer] doctor    Social History Main Topics  . Smoking status: Current Everyday Smoker  . Smokeless tobacco: Not on file  . Alcohol Use: Not on file  . Drug Use: No  . Sexually Active: Not on file   Other Topics Concern  . Not on file   Social History Narrative  . No narrative on file    Current Outpatient Prescriptions on File Prior to Visit  Medication Sig Dispense Refill  . hydrochlorothiazide (HYDRODIURIL) 25 MG tablet Take 1 tablet (25 mg total) by mouth every morning.  30 tablet  0  . levothyroxine (SYNTHROID, LEVOTHROID) 125 MCG tablet Take 1 tablet (125 mcg total) by mouth daily.  30 tablet  3  . pravastatin (PRAVACHOL) 40 MG tablet Take 40 mg by mouth daily.        . vardenafil (LEVITRA) 20 MG tablet Take 10 mg by mouth as needed.        . Multiple Vitamin (MULTIVITAMIN) tablet Take 1 tablet by mouth daily.          Allergies  Allergen Reactions  . Iodine     Family History  Problem Relation Age of Onset  . Graves' disease Daughter     BP 152/102  Pulse 61  Temp(Src) 98.1 F  (36.7 C) (Oral)  Ht 6' (1.829 m)  Wt 177 lb 12.8 oz (80.65 kg)  BMI 24.11 kg/m2  SpO2 99%  Review of Systems Denies weight change    Objective:   Physical Exam VITAL SIGNS:  See vs page GENERAL: no distress head: no deformity eyes: no periorbital swelling.  There is slight bilat proptosis external nose and ears are normal mouth: no lesion seen NECK: There is no palpable thyroid enlargement.  No thyroid nodule is palpable.  No palpable lymphadenopathy at the anterior neck.    Assessment & Plan:  Post-i-131 hypothyroidism, on synthroid. Htn, with ? Of situational component.

## 2011-02-27 NOTE — Patient Instructions (Addendum)
blood tests are being requested for you today.  please call [phone] to hear your test results.  You will be prompted to enter the 9-digit "MRN" number that appears at the top left of this page, followed by #.  Then you will hear the message. pending the test results, please continue the same levothyroxine for now.   Please see dr copland soon, as you also need your blood pressure rechecked.  i also think he would be happy to take over the thyroid checks and medication.   i would be happy to see you back here as needed.

## 2011-03-28 ENCOUNTER — Other Ambulatory Visit: Payer: Self-pay | Admitting: Family Medicine

## 2011-04-30 ENCOUNTER — Other Ambulatory Visit: Payer: Self-pay | Admitting: Endocrinology

## 2011-08-14 ENCOUNTER — Other Ambulatory Visit (INDEPENDENT_AMBULATORY_CARE_PROVIDER_SITE_OTHER): Payer: BC Managed Care – PPO

## 2011-08-14 ENCOUNTER — Other Ambulatory Visit: Payer: Self-pay | Admitting: Endocrinology

## 2011-08-14 DIAGNOSIS — Z13220 Encounter for screening for lipoid disorders: Secondary | ICD-10-CM

## 2011-08-14 DIAGNOSIS — R5381 Other malaise: Secondary | ICD-10-CM

## 2011-08-14 DIAGNOSIS — E039 Hypothyroidism, unspecified: Secondary | ICD-10-CM

## 2011-08-14 LAB — CBC WITH DIFFERENTIAL/PLATELET
Basophils Absolute: 0 10*3/uL (ref 0.0–0.1)
HCT: 47.1 % (ref 39.0–52.0)
Lymphs Abs: 1.7 10*3/uL (ref 0.7–4.0)
MCV: 85.9 fl (ref 78.0–100.0)
Monocytes Absolute: 0.6 10*3/uL (ref 0.1–1.0)
Platelets: 178 10*3/uL (ref 150.0–400.0)
RDW: 15.3 % — ABNORMAL HIGH (ref 11.5–14.6)

## 2011-08-14 LAB — HEPATIC FUNCTION PANEL
Albumin: 4.4 g/dL (ref 3.5–5.2)
Alkaline Phosphatase: 58 U/L (ref 39–117)
Bilirubin, Direct: 0 mg/dL (ref 0.0–0.3)

## 2011-08-14 LAB — LIPID PANEL
Cholesterol: 253 mg/dL — ABNORMAL HIGH (ref 0–200)
Triglycerides: 67 mg/dL (ref 0.0–149.0)

## 2011-08-14 LAB — BASIC METABOLIC PANEL
Calcium: 9.6 mg/dL (ref 8.4–10.5)
GFR: 86.9 mL/min (ref >60.00)
Sodium: 137 mEq/L (ref 135–145)

## 2011-08-15 LAB — LDL CHOLESTEROL, DIRECT: Direct LDL: 170.1 mg/dL

## 2011-08-21 ENCOUNTER — Encounter: Payer: Self-pay | Admitting: Family Medicine

## 2011-08-21 ENCOUNTER — Ambulatory Visit (INDEPENDENT_AMBULATORY_CARE_PROVIDER_SITE_OTHER): Payer: BC Managed Care – PPO | Admitting: Family Medicine

## 2011-08-21 ENCOUNTER — Encounter: Payer: Self-pay | Admitting: Internal Medicine

## 2011-08-21 ENCOUNTER — Encounter: Payer: Self-pay | Admitting: *Deleted

## 2011-08-21 VITALS — BP 124/82 | HR 87 | Temp 98.4°F | Ht 72.0 in | Wt 174.8 lb

## 2011-08-21 DIAGNOSIS — Z1211 Encounter for screening for malignant neoplasm of colon: Secondary | ICD-10-CM

## 2011-08-21 DIAGNOSIS — Z0000 Encounter for general adult medical examination without abnormal findings: Secondary | ICD-10-CM | POA: Insufficient documentation

## 2011-08-21 DIAGNOSIS — Z125 Encounter for screening for malignant neoplasm of prostate: Secondary | ICD-10-CM

## 2011-08-21 LAB — PSA: PSA: 0.74 ng/mL (ref 0.10–4.00)

## 2011-08-21 NOTE — Patient Instructions (Signed)
REFERRAL: GO THE THE FRONT ROOM AT THE ENTRANCE OF OUR CLINIC, NEAR CHECK IN. ASK FOR Fernando Bell. SHE WILL HELP YOU SET UP YOUR REFERRAL. DATE: TIME:  

## 2011-08-21 NOTE — Progress Notes (Signed)
Patient Name: Fernando Bell Date of Birth: [birth year] Medical Record Number: [id number] Gender: male Date of Encounter: 08/21/2011  History of Present Illness:  Fernando Bell is a 51 y.o. very pleasant male patient who presents with the following:  Preventative Health Maintenance Visit:  Health Maintenance Summary Reviewed and updated, unless pt declines services.  Tobacco History Reviewed. Alcohol: No concerns, no excessive use Exercise Habits: Some activity, rec at least 30 mins 5 times a week STD concerns: no risk or activity to increase risk Drug Use: None Encouraged self-testicular check  Health Maintenance  Topic Date Due  . Tetanus/tdap  01/17/1979  . Colonoscopy  08/21/2002  . Influenza Vaccine  01/23/2012    Labs reviewed with the patient.   Lipids:    Component Value Date/Time   CHOL 253* 08/14/2011 1103   TRIG 67.0 08/14/2011 1103   HDL 73.60 08/14/2011 1103   LDLDIRECT 170.1 08/14/2011 1103   VLDL 13.4 08/14/2011 1103   CHOLHDL 3 08/14/2011 1103    CBC:    Component Value Date/Time   WBC 4.4* 08/14/2011 1103   HGB 15.7 08/14/2011 1103   HCT 47.1 08/14/2011 1103   PLT 178.0 08/14/2011 1103   MCV 85.9 08/14/2011 1103   NEUTROABS 2.1 08/14/2011 1103   LYMPHSABS 1.7 08/14/2011 1103   MONOABS 0.6 08/14/2011 1103   EOSABS 0.0 08/14/2011 1103   BASOSABS 0.0 08/14/2011 1103    Basic Metabolic Panel:    Component Value Date/Time   NA 137 08/14/2011 1103   K 4.1 08/14/2011 1103   CL 103 08/14/2011 1103   CO2 26 08/14/2011 1103   BUN 16 08/14/2011 1103   CREATININE 1.1 08/14/2011 1103   GLUCOSE 93 08/14/2011 1103   CALCIUM 9.6 08/14/2011 1103    Lab Results  Component Value Date   ALT 17 08/14/2011   AST 24 08/14/2011   ALKPHOS 58 08/14/2011   BILITOT 0.4 08/14/2011    Lab Results  Component Value Date   PSA 0.72 06/29/2009   PSA 0.69 06/29/2008     Colon  Chol, does not eat right. Eats, bacon, eggs, chili dogs, and eats whatever he wants.   Smoking, but taking  baby steps. Cutout the menthols. Now smoking [place] lights.  05/2011 last menthol 1/2 - 1 ppd  Colonoscopy  Patient Active Problem List  Diagnoses  . GRAVES' DISEASE  . HYPERLIPIDEMIA  . TOBACCO ABUSE  . HYPERTENSION  . BACK PAIN  . OTHER MALAISE AND FATIGUE  . PROTEINURIA  . ERECTILE DYSFUNCTION, ORGANIC  . Other postablative hypothyroidism   Past Medical History  Diagnosis Date  . GRAVES' DISEASE 07/28/2009  . HYPERTHYROIDISM 07/27/2009  . HYPERLIPIDEMIA 07/05/2008  . TOBACCO ABUSE 04/23/2008  . HYPERTENSION 04/23/2008  . BACK PAIN 04/23/2008  . Other malaise and fatigue 06/29/2009  . Proteinuria 01/25/2009  . ERECTILE DYSFUNCTION, 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   Past Surgical History  Procedure Date  . 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 Small intestine surgery     remote bowel surgeries >15 years ago   History  Substance Use Topics  . Smoking status: Current Everyday Smoker  . Smokeless tobacco: Not on file  . Alcohol Use: Not on file   Family History  Problem Relation Age of Onset  . Graves' disease Daughter    Allergies  Allergen Reactions  . Iodine     Medication list has been reviewed and updated.  Review of Systems:  General: Denies fever, chills, sweats. No significant weight  loss. Eyes: Denies blurring,significant itching ENT: Denies earache, sore throat, and hoarseness. Cardiovascular: Denies chest pains, palpitations, dyspnea on exertion Respiratory: Denies cough, dyspnea at rest,wheeezing Breast: no concerns about lumps GI: Denies nausea, vomiting, diarrhea, constipation, change in bowel habits, abdominal pain, melena, hematochezia GU: Denies penile discharge, ED, urinary flow / outflow problems. No STD concerns. Musculoskeletal: Denies back pain, joint pain Derm: Denies rash, itching Neuro: Denies  paresthesias, frequent falls, frequent headaches Psych: Denies depression, anxiety Endocrine: Denies cold intolerance, heat intolerance,  polydipsia Heme: Denies enlarged lymph nodes Allergy: No hayfever  Physical Examination: Filed Vitals:   08/21/11 0843  BP: 124/82  Pulse: 87  Temp: 98.4 F (36.9 C)  TempSrc: Oral  Height: 6' (1.829 m)  Weight: 174 lb 12.8 oz (79.289 kg)  SpO2: 98%    Body mass index is 23.71 kg/(m^2).   Wt Readings from Last 3 Encounters:  08/21/11 174 lb 12.8 oz (79.289 kg)  02/27/11 177 lb 12.8 oz (80.65 kg)  12/27/10 177 lb 3.2 oz (80.377 kg)    GEN: well developed, well nourished, no acute distress Eyes: conjunctiva and lids normal, PERRLA, EOMI ENT: TM clear, nares clear, oral exam WNL Neck: supple, no lymphadenopathy, no thyromegaly, no JVD Pulm: clear to auscultation and percussion, respiratory effort normal CV: regular rate and rhythm, S1-S2, no murmur, rub or gallop, no bruits, peripheral pulses normal and symmetric, no cyanosis, clubbing, edema or varicosities Chest: no scars, masses GI: soft, non-tender; no hepatosplenomegaly, masses; active bowel sounds all quadrants GU: pt deferred Lymph: no cervical, axillary or inguinal adenopathy MSK: gait normal, muscle tone and strength WNL, no joint swelling, effusions, discoloration, crepitus  SKIN: clear, good turgor, color WNL, no rashes, lesions, or ulcerations Neuro: normal mental status, normal strength, sensation, and motion Psych: alert; oriented to person, place and time, normally interactive and not anxious or depressed in appearance.   Assessment and Plan:  1. Routine general medical examination at a health care facility    2. Screening for colon cancer  Ambulatory referral to Gastroenterology  3. Special screening for malignant neoplasm of prostate  PSA   The patient's preventative maintenance and recommended screening tests for an annual wellness exam were reviewed in full today. Brought up to date unless services declined.  Counselled on the importance of diet, exercise, and its role in overall health and  mortality. The patient's FH and SH was reviewed, including their home life, tobacco status, and drug and alcohol status.   Work on smoking, diet. Lipids worsened, has not been taking meds every day, diet bad  Orders Today: Orders Placed This Encounter  Procedures  . PSA  . Ambulatory referral to Gastroenterology    Referral Priority:  Routine    Referral Type:  Consultation    Referral Reason:  Specialty Services Required    Requested Specialty:  Gastroenterology    Number of Visits Requested:  1    Medications Today: No orders of the defined types were placed in this encounter.

## 2011-09-04 ENCOUNTER — Encounter: Payer: Self-pay | Admitting: Internal Medicine

## 2011-09-04 ENCOUNTER — Ambulatory Visit (AMBULATORY_SURGERY_CENTER): Payer: BC Managed Care – PPO | Admitting: *Deleted

## 2011-09-04 VITALS — Ht 72.0 in | Wt 175.0 lb

## 2011-09-04 DIAGNOSIS — Z1211 Encounter for screening for malignant neoplasm of colon: Secondary | ICD-10-CM

## 2011-09-04 MED ORDER — PEG-KCL-NACL-NASULF-NA ASC-C 100 G PO SOLR: ORAL | Status: DC

## 2011-09-04 NOTE — Progress Notes (Signed)
Fernando Bell had a colon in 37 in [place].  He had hemorrhoids and fistula for which he had surgery 1993.  He does not have the information to send for these records.

## 2011-09-11 ENCOUNTER — Other Ambulatory Visit: Payer: Self-pay | Admitting: Family Medicine

## 2011-09-11 ENCOUNTER — Telehealth: Payer: Self-pay | Admitting: *Deleted

## 2011-09-11 NOTE — Telephone Encounter (Signed)
Suprep Kit ordered.  Movi prep DC'd.  Entry Error/Tamiya Colello EdmonsonRN

## 2011-09-22 ENCOUNTER — Ambulatory Visit (AMBULATORY_SURGERY_CENTER): Payer: BC Managed Care – PPO | Admitting: Internal Medicine

## 2011-09-22 ENCOUNTER — Encounter: Payer: Self-pay | Admitting: Internal Medicine

## 2011-09-22 VITALS — BP 129/88 | HR 74 | Temp 98.0°F | Resp 20 | Ht 72.0 in | Wt 175.0 lb

## 2011-09-22 DIAGNOSIS — Z1211 Encounter for screening for malignant neoplasm of colon: Secondary | ICD-10-CM

## 2011-09-22 MED ORDER — SODIUM CHLORIDE 0.9 % IV SOLN: 500.0000 mL | INTRAVENOUS | Status: DC

## 2011-09-22 NOTE — Op Note (Signed)
[facility] [address] [place], [place]  [address]  COLONOSCOPY PROCEDURE REPORT  PATIENT:  Emad, Brechtel  MR#:  [id number] BIRTHDATE:  07-Sep-1960, 50 yrs. old  GENDER:  male ENDOSCOPIST:  Carie Caddy. Shatyra Becka, MD REF. BY:  [name] T. Copland, M.D. PROCEDURE DATE:  09/22/2011 PROCEDURE:  Colonoscopy 19147 ASA CLASS:  Class II INDICATIONS:  Routine Risk Screening MEDICATIONS:   MAC sedation, administered by CRNA, propofol (Diprivan) 350 mg IV  DESCRIPTION OF PROCEDURE:   After the risks benefits and alternatives of the procedure were thoroughly explained, informed consent was obtained.  Digital rectal exam was performed and revealed no rectal masses.   The LB [id number]-[id number] endoscope was introduced through the anus and advanced to the terminal ileum which was intubated for a short distance, without limitations. The quality of the prep was Suprep excellent.  The instrument was then slowly withdrawn as the colon was fully examined. <<PROCEDUREIMAGES>>  FINDINGS:  The terminal ileum appeared normal.  A diverticulum was found in the sigmoid colon.  Otherwise normal colonoscopy without other polyps, masses, vascular ectasias, or inflammatory changes. Retroflexed views in the rectum revealed no abnormalities.  The scope was then withdrawn from the cecum and the procedure completed.  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 There is no need for FOBT (stool) testing for at least 5 years.  Carie Caddy. Rhea Belton, MD  CC:  Juleen China, MD The Patient  n. eSIGNEDCarie Caddy. Nicklous Aburto at 09/22/2011 11:56 AM  [place], [phone]

## 2011-09-22 NOTE — Patient Instructions (Signed)
Normal colonoscopy  High fiber diet recommended.  Repeat colonoscopy in 10 years.  YOU HAD AN ENDOSCOPIC PROCEDURE TODAY AT THE [facility]: Refer to the procedure report that was given to you for any specific questions about what was found during the examination.  If the procedure report does not answer your questions, please call your gastroenterologist to clarify.  If you requested that your care partner not be given the details of your procedure findings, then the procedure report has been included in a sealed envelope for you to review at your convenience later.  YOU SHOULD EXPECT: Some feelings of bloating in the abdomen. Passage of more gas than usual.  Walking can help get rid of the air that was put into your GI tract during the procedure and reduce the bloating. If you had a lower endoscopy (such as a colonoscopy or flexible sigmoidoscopy) you may notice spotting of blood in your stool or on the toilet paper. If you underwent a bowel prep for your procedure, then you may not have a normal bowel movement for a few days.  DIET: Your first meal following the procedure should be a light meal and then it is ok to progress to your normal diet.  A half-sandwich or bowl of soup is an example of a good first meal.  Heavy or fried foods are harder to digest and may make you feel nauseous or bloated.  Likewise meals heavy in dairy and vegetables can cause extra gas to form and this can also increase the bloating.  Drink plenty of fluids but you should avoid alcoholic beverages for 24 hours.  ACTIVITY: Your care partner should take you home directly after the procedure.  You should plan to take it easy, moving slowly for the rest of the day.  You can resume normal activity the day after the procedure however you should NOT DRIVE or use heavy machinery for 24 hours (because of the sedation medicines used during the test).    SYMPTOMS TO REPORT IMMEDIATELY: A gastroenterologist can be reached  at any hour.  During normal business hours, 8:30 AM to 5:00 PM Monday through Friday, call [phone].  After hours and on weekends, please call the GI answering service at [phone] who will take a message and have the physician on call contact you.   Following lower endoscopy (colonoscopy or flexible sigmoidoscopy):  Excessive amounts of blood in the stool  Significant tenderness or worsening of abdominal pains  Swelling of the abdomen that is new, acute  Fever of 100F or higher  FOLLOW UP: If any biopsies were taken you will be contacted by phone or by letter within the next 1-3 weeks.  Call your gastroenterologist if you have not heard about the biopsies in 3 weeks.  Our staff will call the home number listed on your records the next business day following your procedure to check on you and address any questions or concerns that you may have at that time regarding the information given to you following your procedure. This is a courtesy call and so if there is no answer at the home number and we have not heard from you through the emergency physician on call, we will assume that you have returned to your regular daily activities without incident.  SIGNATURES/CONFIDENTIALITY: You and/or your care partner have signed paperwork which will be entered into your electronic medical record.  These signatures attest to the fact that that the information above on your After Visit Summary  has been reviewed and is understood.  Full responsibility of the confidentiality of this discharge information lies with you and/or your care-partner.  

## 2011-09-22 NOTE — Progress Notes (Signed)
Patient did not experience any of the following events: a burn prior to discharge; a fall within the facility; wrong site/side/patient/procedure/implant event; or a hospital transfer or hospital admission upon discharge from the facility. (G8907) Patient did not have preoperative order for IV antibiotic SSI prophylaxis. (G8918)  

## 2011-09-25 ENCOUNTER — Telehealth: Payer: Self-pay | Admitting: *Deleted

## 2011-09-25 NOTE — Telephone Encounter (Signed)
  Follow up Call-  Call back number 09/22/2011  Post procedure Call Back phone  # [phone]  Permission to leave phone message Yes     Patient questions:  Do you have a fever, pain , or abdominal swelling? no Pain Score  0 *  Have you tolerated food without any problems? yes  Have you been able to return to your normal activities? yes  Do you have any questions about your discharge instructions: Diet   no Medications  no Follow up visit  no  Do you have questions or concerns about your Care? no  Actions: * If pain score is 4 or above: No action needed, pain <4.

## 2011-11-08 ENCOUNTER — Other Ambulatory Visit: Payer: Self-pay | Admitting: Family Medicine

## 2012-05-25 ENCOUNTER — Other Ambulatory Visit: Payer: Self-pay | Admitting: Family Medicine

## 2012-05-25 ENCOUNTER — Other Ambulatory Visit: Payer: Self-pay | Admitting: Endocrinology

## 2012-05-27 ENCOUNTER — Telehealth: Payer: Self-pay | Admitting: Endocrinology

## 2012-05-27 NOTE — Telephone Encounter (Signed)
Patient request refill on levothyroxine. Last OV 02/2011.

## 2012-05-27 NOTE — Telephone Encounter (Signed)
please call patient: Ov is due 

## 2012-06-17 ENCOUNTER — Ambulatory Visit (INDEPENDENT_AMBULATORY_CARE_PROVIDER_SITE_OTHER): Payer: 59 | Admitting: Endocrinology

## 2012-06-17 ENCOUNTER — Encounter: Payer: Self-pay | Admitting: Endocrinology

## 2012-06-17 VITALS — BP 130/76 | HR 100 | Wt 172.0 lb

## 2012-06-17 DIAGNOSIS — E890 Postprocedural hypothyroidism: Secondary | ICD-10-CM

## 2012-06-17 NOTE — Patient Instructions (Addendum)
blood tests are being requested for you today.  We'll contact you with results.  

## 2012-06-17 NOTE — Progress Notes (Signed)
  Subjective:    Patient ID: Fernando Bell, male    DOB: 05-18-1960, 52 y.o.   MRN: [id number]  HPI In late 2011, pt had i-131 rx for hyperthyroidism, due to grave's dz.  He has been on synthroid since then.  Denies weight change.   Past Medical History  Diagnosis Date  . GRAVES' DISEASE 07/28/2009  . HYPERTHYROIDISM 07/27/2009  . HYPERLIPIDEMIA 07/05/2008  . TOBACCO ABUSE 04/23/2008  . HYPERTENSION 04/23/2008  . BACK PAIN 04/23/2008  . Other malaise and fatigue 06/29/2009  . Proteinuria 01/25/2009  . 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 Blood transfusion 1993    after hemorrhoid surgery  . Recurrent kidney stones     Past Surgical History  Procedure Laterality Date  . Relocation of left undescended testicle at age 73    . Hemorrhoid surgery  1993  . Av fistula repair  1993  . Small intestine surgery      remote bowel surgeries >15 years ago    History   Social History  . Marital Status: Married    Spouse Name: N/A    Number of Children: N/A  . Years of Education: N/A   Occupational History  . [employer] doctor    Social History Main Topics  . Smoking status: Current Every Day Smoker -- 0.80 packs/day    Types: Cigarettes  . Smokeless tobacco: Not on file  . Alcohol Use: 1.8 oz/week    3 Cans of beer per week  . Drug Use: No  . Sexually Active: Not on file   Other Topics Concern  . Not on file   Social History Narrative  . No narrative on file    Current Outpatient Prescriptions on File Prior to Visit  Medication Sig Dispense Refill  . hydrochlorothiazide (HYDRODIURIL) 25 MG tablet TAKE ONE TABLET BY MOUTH EVERY MORNING  30 tablet  2  . levothyroxine (SYNTHROID, LEVOTHROID) 125 MCG tablet TAKE ONE TABLET BY MOUTH ONE TIME DAILY  30 tablet  0  . pravastatin (PRAVACHOL) 40 MG tablet TAKE ONE TABLET BY MOUTH NIGHTLY AT BEDTIME  30 tablet  2   No current facility-administered medications on file prior to visit.    Allergies  Allergen Reactions  . Iodine  Shortness Of Breath    Family History  Problem Relation Age of Onset  . Graves' disease Daughter     BP 130/76  Pulse 100  Wt 172 lb (78.019 kg)  BMI 23.32 kg/m2  SpO2 96%  Review of Systems Denies tremor    Objective:   Physical Exam VITAL SIGNS:  See vs page GENERAL: no distress head: no deformity eyes: mild bilateral proptosis external nose and ears are normal NECK: There is no palpable thyroid enlargement.  No thyroid nodule is palpable.  No palpable lymphadenopathy at the anterior neck.      Assessment & Plan:  Post-i-131 hypothyroidism, on synthroid

## 2012-06-26 ENCOUNTER — Other Ambulatory Visit: Payer: Self-pay | Admitting: Endocrinology

## 2012-06-26 ENCOUNTER — Telehealth: Payer: Self-pay | Admitting: Endocrinology

## 2012-06-26 ENCOUNTER — Other Ambulatory Visit: Payer: Self-pay | Admitting: *Deleted

## 2012-06-26 MED ORDER — LEVOTHYROXINE SODIUM 125 MCG PO TABS: 125.0000 ug | ORAL_TABLET | Freq: Every day | ORAL | Status: DC

## 2012-06-26 MED ORDER — PRAVASTATIN SODIUM 40 MG PO TABS: 20.0000 mg | ORAL_TABLET | Freq: Every day | ORAL | Status: DC

## 2012-06-26 NOTE — Telephone Encounter (Signed)
[facility] Triage Call Report Triage Record Num: [id number] Operator: Remonia Richter Patient Name: Fernando Bell Call Date & Time: 06/25/2012 6:53:56PM Patient Phone: [phone] PCP: Romero Belling Patient Gender: Male PCP Fax : [phone] Patient DOB: Jun 15, 1960 Practice Name: [facility] Reason for Call: Caller: Fernando Bell/Patient; PCP: Romero Belling (Adults only); CB#: [phone]; Call regarding Medication Refill; seen last week in office and did not get a refill on Levothyroxin to pharmacy after visit, he is going OOT and will not be back x 2 weeks and completely out,Epic record checked and Dr Lorin Picket called and agreed to call in a one month supply Synthroid 1 tab Po QD and called to [facility] at [phone] per Epic record and MD approval, patient informed Protocol(s) Used: Medication Questions - Adult Recommended Outcome per Protocol: Call Dispensing Pharmacy or Provider Immediately Reason for Outcome: Unable to obtain prescribed medication related to available resources AND situation poses immediate clinical risk

## 2012-06-26 NOTE — Telephone Encounter (Signed)
i refilled prn 

## 2012-10-21 ENCOUNTER — Telehealth: Payer: Self-pay | Admitting: Family Medicine

## 2012-10-21 ENCOUNTER — Other Ambulatory Visit: Payer: Self-pay

## 2012-10-21 MED ORDER — HYDROCHLOROTHIAZIDE 25 MG PO TABS: ORAL_TABLET | ORAL | Status: DC

## 2012-10-21 NOTE — Telephone Encounter (Signed)
[facility] Triage Call Report Triage Record Num: [id number] Operator: Hillary Bow Patient Name: Fernando Bell Call Date & Time: 10/19/2012 3:47:19PM Patient Phone: [phone] PCP: Hannah Beat Patient Gender: Male PCP Fax : [phone] Patient DOB: [birth year] [facility] Name[facility] Reason for Call: Caller: Shefali; [facility] [id number], [name]; CB#: [phone]; Call regarding HCTZ refill. Pt is completely out of med. Pharmacy calling for Pt. Per standing orders: HCTZ 25mg  take 1 tablet daily, #3, no refills, called into [facility], same Pharmacy as in EPIC. Advised Pt to f/u w/ office on 6-30. Pharmacist will inform Pt. Protocol(s) Used: Medication Questions - Adult Recommended Outcome per Protocol: Provided Health Information Reason for Outcome: Caller has medication question(s) that was answered with available resources Care Advice: ~ 10/19/2012 3:55:32PM Page 1 of 1 [email or web address]

## 2012-10-22 MED ORDER — HYDROCHLOROTHIAZIDE 25 MG PO TABS: ORAL_TABLET | ORAL | Status: DC

## 2012-10-22 NOTE — Addendum Note (Signed)
Addended by: Consuello Masse on: 10/22/2012 11:23 AM   Modules accepted: Orders

## 2012-10-22 NOTE — Telephone Encounter (Signed)
Medication sent to pharmacy and pharmacy will advise patient to schedule appt

## 2012-10-22 NOTE — Telephone Encounter (Signed)
Please  Refill HCTZ #30, 1 refill. Schedule CPX in the next 1-2 months at his convenience

## 2013-02-27 ENCOUNTER — Other Ambulatory Visit: Payer: Self-pay

## 2013-04-08 ENCOUNTER — Other Ambulatory Visit: Payer: Self-pay | Admitting: Endocrinology

## 2013-04-09 ENCOUNTER — Other Ambulatory Visit: Payer: Self-pay | Admitting: Endocrinology

## 2013-05-15 ENCOUNTER — Other Ambulatory Visit: Payer: Self-pay

## 2013-05-15 MED ORDER — HYDROCHLOROTHIAZIDE 25 MG PO TABS: ORAL_TABLET | ORAL | Status: DC

## 2013-05-15 NOTE — Telephone Encounter (Signed)
[place] in [place] and not returning home for ? # of months. Pt scheduled CPX on 08/18/13. Refills given for HCTZ until appt.

## 2013-05-19 LAB — TROPONIN I: Troponin-I: <0.02 ng/mL

## 2013-05-19 LAB — CBC
HCT: 48.4 % (ref 40.0–52.0)
HGB: 15.8 g/dL (ref 13.0–18.0)
MCH: 27.6 pg (ref 26.0–34.0)
MCHC: 32.7 g/dL (ref 32.0–36.0)
MCV: 84 fL (ref 80–100)
Platelet: 195 10*3/uL (ref 150–440)
RBC: 5.74 10*6/uL (ref 4.40–5.90)
RDW: 14.6 % — ABNORMAL HIGH (ref 11.5–14.5)
WBC: 4.9 10*3/uL (ref 3.8–10.6)

## 2013-05-19 LAB — BASIC METABOLIC PANEL
ANION GAP: 6 — AB (ref 7–16)
BUN: 15 mg/dL (ref 7–18)
CHLORIDE: 102 mmol/L (ref 98–107)
CO2: 27 mmol/L (ref 21–32)
CREATININE: 1.1 mg/dL (ref 0.60–1.30)
Calcium, Total: 9.7 mg/dL (ref 8.5–10.1)
EGFR (African American): >60
EGFR (Non-African Amer.): >60
Glucose: 147 mg/dL — ABNORMAL HIGH (ref 65–99)
OSMOLALITY: 274 (ref 275–301)
Potassium: 3.4 mmol/L — ABNORMAL LOW (ref 3.5–5.1)
Sodium: 135 mmol/L — ABNORMAL LOW (ref 136–145)

## 2013-05-20 ENCOUNTER — Observation Stay: Payer: Self-pay | Admitting: Internal Medicine

## 2013-05-20 LAB — LIPID PANEL
Cholesterol: 220 mg/dL — ABNORMAL HIGH (ref 0–200)
HDL: 56 mg/dL (ref 40–60)
LDL CHOLESTEROL, CALC: 130 mg/dL — AB (ref 0–100)
Triglycerides: 169 mg/dL (ref 0–200)
VLDL Cholesterol, Calc: 34 mg/dL (ref 5–40)

## 2013-05-20 LAB — CK-MB
CK-MB: 0.8 ng/mL (ref 0.5–3.6)
CK-MB: 0.7 ng/mL (ref 0.5–3.6)
CK-MB: 0.5 ng/mL (ref 0.5–3.6)

## 2013-05-20 LAB — TROPONIN I: Troponin-I: <0.02 ng/mL

## 2013-05-20 LAB — TSH: Thyroid Stimulating Horm: 0.168 u[IU]/mL — ABNORMAL LOW

## 2013-06-02 ENCOUNTER — Ambulatory Visit: Payer: 59 | Admitting: Family Medicine

## 2013-06-02 DIAGNOSIS — Z0289 Encounter for other administrative examinations: Secondary | ICD-10-CM

## 2013-07-12 ENCOUNTER — Other Ambulatory Visit: Payer: Self-pay | Admitting: Endocrinology

## 2013-08-15 ENCOUNTER — Other Ambulatory Visit: Payer: 59

## 2013-08-18 ENCOUNTER — Encounter: Payer: 59 | Admitting: Family Medicine

## 2013-08-25 ENCOUNTER — Encounter: Payer: 59 | Admitting: Family Medicine

## 2013-08-30 ENCOUNTER — Other Ambulatory Visit: Payer: Self-pay | Admitting: Endocrinology

## 2013-09-18 ENCOUNTER — Encounter: Payer: 59 | Admitting: Family Medicine

## 2013-10-04 ENCOUNTER — Other Ambulatory Visit: Payer: Self-pay | Admitting: Family Medicine

## 2013-10-04 ENCOUNTER — Other Ambulatory Visit: Payer: Self-pay | Admitting: Endocrinology

## 2013-10-06 ENCOUNTER — Other Ambulatory Visit: Payer: Self-pay

## 2013-10-06 ENCOUNTER — Other Ambulatory Visit: Payer: Self-pay | Admitting: Endocrinology

## 2013-10-06 ENCOUNTER — Telehealth: Payer: Self-pay | Admitting: Endocrinology

## 2013-10-06 MED ORDER — HYDROCHLOROTHIAZIDE 25 MG PO TABS: ORAL_TABLET | ORAL | Status: DC

## 2013-10-06 NOTE — Telephone Encounter (Signed)
[facility] [place]  [facility] pill   Call [phone]  Thank You :)

## 2013-10-06 NOTE — Telephone Encounter (Signed)
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

## 2013-10-29 ENCOUNTER — Ambulatory Visit (INDEPENDENT_AMBULATORY_CARE_PROVIDER_SITE_OTHER): Payer: BC Managed Care – PPO | Admitting: Family Medicine

## 2013-10-29 ENCOUNTER — Encounter: Payer: Self-pay | Admitting: Family Medicine

## 2013-10-29 ENCOUNTER — Telehealth: Payer: Self-pay | Admitting: Family Medicine

## 2013-10-29 VITALS — BP 116/80 | HR 69 | Temp 98.4°F | Ht 70.5 in | Wt 173.2 lb

## 2013-10-29 DIAGNOSIS — Z0000 Encounter for general adult medical examination without abnormal findings: Secondary | ICD-10-CM

## 2013-10-29 DIAGNOSIS — E785 Hyperlipidemia, unspecified: Secondary | ICD-10-CM

## 2013-10-29 DIAGNOSIS — I10 Essential (primary) hypertension: Secondary | ICD-10-CM

## 2013-10-29 DIAGNOSIS — E890 Postprocedural hypothyroidism: Secondary | ICD-10-CM

## 2013-10-29 DIAGNOSIS — E0500 Thyrotoxicosis with diffuse goiter without thyrotoxic crisis or storm: Secondary | ICD-10-CM

## 2013-10-29 DIAGNOSIS — Z79899 Other long term (current) drug therapy: Secondary | ICD-10-CM

## 2013-10-29 DIAGNOSIS — F17200 Nicotine dependence, unspecified, uncomplicated: Secondary | ICD-10-CM

## 2013-10-29 LAB — CBC WITH DIFFERENTIAL/PLATELET
Basophils Absolute: 0 10*3/uL (ref 0.0–0.1)
Basophils Relative: 0.3 % (ref 0.0–3.0)
EOS PCT: 0.9 % (ref 0.0–5.0)
Eosinophils Absolute: 0 10*3/uL (ref 0.0–0.7)
HCT: 43.8 % (ref 39.0–52.0)
Hemoglobin: 14.6 g/dL (ref 13.0–17.0)
LYMPHS ABS: 2.1 10*3/uL (ref 0.7–4.0)
Lymphocytes Relative: 39.5 % (ref 12.0–46.0)
MCHC: 33.3 g/dL (ref 30.0–36.0)
MCV: 82.3 fl (ref 78.0–100.0)
MONO ABS: 0.8 10*3/uL (ref 0.1–1.0)
Monocytes Relative: 14.5 % — ABNORMAL HIGH (ref 3.0–12.0)
NEUTROS PCT: 44.8 % (ref 43.0–77.0)
Neutro Abs: 2.3 10*3/uL (ref 1.4–7.7)
PLATELETS: 234 10*3/uL (ref 150.0–400.0)
RBC: 5.32 Mil/uL (ref 4.22–5.81)
RDW: 14.9 % (ref 11.5–15.5)
WBC: 5.2 10*3/uL (ref 4.0–10.5)

## 2013-10-29 LAB — URINALYSIS, ROUTINE W REFLEX MICROSCOPIC
Bilirubin Urine: NEGATIVE
Hgb urine dipstick: NEGATIVE
KETONES UR: NEGATIVE
Leukocytes, UA: NEGATIVE
Nitrite: NEGATIVE
SPECIFIC GRAVITY, URINE: 1.02 (ref 1.000–1.030)
Urine Glucose: NEGATIVE
Urobilinogen, UA: 1 (ref 0.0–1.0)
pH: 6.5 (ref 5.0–8.0)

## 2013-10-29 LAB — BASIC METABOLIC PANEL
BUN: 15 mg/dL (ref 6–23)
CHLORIDE: 102 meq/L (ref 96–112)
CO2: 27 meq/L (ref 19–32)
CREATININE: 1.1 mg/dL (ref 0.4–1.5)
Calcium: 9.7 mg/dL (ref 8.4–10.5)
GFR: 92.7 mL/min (ref >60.00)
GLUCOSE: 103 mg/dL — AB (ref 70–99)
POTASSIUM: 3.9 meq/L (ref 3.5–5.1)
Sodium: 138 mEq/L (ref 135–145)

## 2013-10-29 LAB — HEPATIC FUNCTION PANEL
ALT: 29 U/L (ref 0–53)
AST: 28 U/L (ref 0–37)
Albumin: 3.9 g/dL (ref 3.5–5.2)
Alkaline Phosphatase: 74 U/L (ref 39–117)
BILIRUBIN TOTAL: 0.4 mg/dL (ref 0.2–1.2)
Bilirubin, Direct: 0 mg/dL (ref 0.0–0.3)
Total Protein: 8 g/dL (ref 6.0–8.3)

## 2013-10-29 LAB — LIPID PANEL
CHOL/HDL RATIO: 4
CHOLESTEROL: 268 mg/dL — AB (ref 0–200)
HDL: 61.7 mg/dL (ref >39.00)
LDL CALC: 187 mg/dL — AB (ref 0–99)
NonHDL: 206.3
TRIGLYCERIDES: 99 mg/dL (ref 0.0–149.0)
VLDL: 19.8 mg/dL (ref 0.0–40.0)

## 2013-10-29 LAB — T3, FREE: T3, Free: 2.9 pg/mL (ref 2.3–4.2)

## 2013-10-29 LAB — T4, FREE: Free T4: 1.12 ng/dL (ref 0.60–1.60)

## 2013-10-29 LAB — TSH: TSH: 0.13 u[IU]/mL — ABNORMAL LOW (ref 0.35–4.50)

## 2013-10-29 NOTE — Progress Notes (Signed)
[place] [address] Phone: [phone] Fax: [phone]  Patient ID: Fernando Bell MRN: [id number], DOB: Aug 25, 1960, 53 y.o. Date of Encounter: 10/29/2013  Primary Physician:  Owens Loffler, MD   Chief Complaint: Annual Exam   Subjective:   History of Present Illness:  Fernando Bell is a 53 y.o. pleasant patient who presents with the following:  Preventative Health Maintenance Visit:  Health Maintenance Summary Reviewed and updated, unless pt declines services.  Tobacco History Reviewed. + decreased and no menthols now. Alcohol: No concerns, no excessive use Exercise Habits: minimal STD concerns: no risk or activity to increase risk Drug Use: None Encouraged self-testicular check  Health Maintenance  Topic Date Due  . Tetanus/tdap  01/17/1979  . Influenza Vaccine  11/22/2013  . Colonoscopy  09/21/2021   Thyroid: No symptoms. Labs reviewed. Denies cold / heat intolerance, dry skin, hair loss. No goiter.  Lab Results  Component Value Date   TSH 0.13* 10/29/2013    HTN: Tolerating all medications without side effects Stable and at goal No CP, no sob. No HA.  BP Readings from Last 3 Encounters:  10/29/13 116/80  06/17/12 130/76  09/22/11 767/20    Basic Metabolic Panel:    Component Value Date/Time   NA 138 10/29/2013 0929   K 3.9 10/29/2013 0929   CL 102 10/29/2013 0929   CO2 27 10/29/2013 0929   BUN 15 10/29/2013 0929   CREATININE 1.1 10/29/2013 0929   GLUCOSE 103* 10/29/2013 0929   CALCIUM 9.7 10/29/2013 0929   Lipids: Doing well, stable. Tolerating meds fine with no SE. Panel reviewed with patient.  Lipids:    Component Value Date/Time   CHOL 268* 10/29/2013 0929   TRIG 99.0 10/29/2013 0929   HDL 61.70 10/29/2013 0929   LDLDIRECT 170.1 08/14/2011 1103   VLDL 19.8 10/29/2013 0929   CHOLHDL 4 10/29/2013 0929    Lab Results  Component Value Date   ALT 29 10/29/2013   AST 28 10/29/2013   ALKPHOS 74 10/29/2013   BILITOT 0.4 10/29/2013     There is  no immunization history on file for this patient.  Patient Active Problem List   Diagnosis Date Noted  . 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 GRAVES' DISEASE 07/28/2009  . OTHER MALAISE AND FATIGUE 06/29/2009  . PROTEINURIA 01/25/2009  . HYPERLIPIDEMIA 07/05/2008  . TOBACCO ABUSE 04/23/2008  . HYPERTENSION 04/23/2008   Past Medical History  Diagnosis Date  . GRAVES' DISEASE 07/28/2009  . HYPERTHYROIDISM 07/27/2009  . HYPERLIPIDEMIA 07/05/2008  . TOBACCO ABUSE 04/23/2008  . HYPERTENSION 04/23/2008  . BACK PAIN 04/23/2008  . Other malaise and fatigue 06/29/2009  . Proteinuria 01/25/2009  . 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 Blood transfusion 1993    after hemorrhoid surgery  . Recurrent kidney stones    Past Surgical History  Procedure Laterality Date  . 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 Hemorrhoid surgery  1993  . Av fistula repair  1993  . Small intestine surgery      remote bowel surgeries >15 years ago   History   Social History  . Marital Status: Married    Spouse Name: N/A    Number of Children: N/A  . Years of Education: N/A   Occupational History  . Truck Geophysicist/field seismologist    Social History Main Topics  . Smoking status: Current Every Day Smoker -- 0.80 packs/day    Types:  Cigarettes  . Smokeless tobacco: Not on file  . Alcohol Use: 1.8 oz/week    3 Cans of beer per week     Comment: occ  . Drug Use: No  . Sexual Activity: Not on file   Other Topics Concern  . Not on file   Social History Narrative  . No narrative on file   Family History  Problem Relation Age of Onset  . Graves' disease Daughter    Allergies  Allergen Reactions  . Iodine Shortness Of Breath    Medication list has been reviewed and updated.  Review of Systems:  General: Denies fever, chills, sweats. No significant weight loss. Eyes: Denies blurring,significant itching ENT: Denies earache, sore throat,  and hoarseness. Cardiovascular: Denies chest pains, palpitations, dyspnea on exertion Respiratory: Denies cough, dyspnea at rest,wheeezing Breast: no concerns about lumps GI: Denies nausea, vomiting, diarrhea, constipation, change in bowel habits, abdominal pain, melena, hematochezia GU: Denies penile discharge, ED, urinary flow / outflow problems. No STD concerns. Musculoskeletal: Denies back pain, joint pain Derm: Denies rash, itching Neuro: Denies  paresthesias, frequent falls, frequent headaches Psych: Denies depression, anxiety Endocrine: Denies cold intolerance, heat intolerance, polydipsia Heme: Denies enlarged lymph nodes Allergy: No hayfever  Objective:   Physical Examination: BP 116/80  Pulse 69  Temp(Src) 98.4 F (36.9 C) (Oral)  Ht 5' 10.5" (1.791 m)  Wt 173 lb 4 oz (78.586 kg)  BMI 24.50 kg/m2  SpO2 98% Ideal Body Weight: Weight in (lb) to have BMI = 25: 176.4  No exam data present  GEN: well developed, well nourished, no acute distress Eyes: conjunctiva and lids normal, PERRLA, EOMI ENT: TM clear, nares clear, oral exam WNL Neck: supple, no lymphadenopathy, no thyromegaly, no JVD Pulm: clear to auscultation and percussion, respiratory effort normal CV: regular rate and rhythm, S1-S2, no murmur, rub or gallop, no bruits, peripheral pulses normal and symmetric, no cyanosis, clubbing, edema or varicosities GI: soft, non-tender; no hepatosplenomegaly, masses; active bowel sounds all quadrants GU: no hernia, testicular mass, penile discharge Lymph: no cervical, axillary or inguinal adenopathy MSK: gait normal, muscle tone and strength WNL, no joint swelling, effusions, discoloration, crepitus  SKIN: clear, good turgor, color WNL, no rashes, lesions, or ulcerations Neuro: normal mental status, normal strength, sensation, and motion Psych: alert; oriented to person, place and time, normally interactive and not anxious or depressed in appearance.  All labs reviewed  with patient.  Lipids:    Component Value Date/Time   CHOL 268* 10/29/2013 0929   TRIG 99.0 10/29/2013 0929   HDL 61.70 10/29/2013 0929   LDLDIRECT 170.1 08/14/2011 1103   VLDL 19.8 10/29/2013 0929   CHOLHDL 4 10/29/2013 0929   CBC: CBC Latest Ref Rng 10/29/2013 08/14/2011 06/29/2009  WBC 4.0 - 10.5 K/uL 5.2 4.4(L) 4.8  Hemoglobin 13.0 - 17.0 g/dL 14.6 15.7 14.2  Hematocrit 39.0 - 52.0 % 43.8 47.1 44.3  Platelets 150.0 - 400.0 K/uL 234.0 178.0 175.1    Basic Metabolic Panel:    Component Value Date/Time   NA 138 10/29/2013 0929   K 3.9 10/29/2013 0929   CL 102 10/29/2013 0929   CO2 27 10/29/2013 0929   BUN 15 10/29/2013 0929   CREATININE 1.1 10/29/2013 0929   GLUCOSE 103* 10/29/2013 0929   CALCIUM 9.7 10/29/2013 0929   Hepatic Function Latest Ref Rng 10/29/2013 08/14/2011 06/29/2009  Total Protein 6.0 - 8.3 g/dL 8.0 7.6 7.8  Albumin 3.5 - 5.2 g/dL 3.9 4.4 3.9  AST 0 - 37 U/L 28  24 29  ALT 0 - 53 U/L 29 17 43  Alk Phosphatase 39 - 117 U/L 74 58 63  Total Bilirubin 0.2 - 1.2 mg/dL 0.4 0.4 0.4  Bilirubin, Direct 0.0 - 0.3 mg/dL 0.0 0.0 0.1    Lab Results  Component Value Date   TSH 0.13* 10/29/2013   Lab Results  Component Value Date   PSA 0.74 08/21/2011   PSA 0.72 06/29/2009   PSA 0.69 06/29/2008    Assessment & Plan:   Routine general medical examination at a health care facility - Plan: Urinalysis, Routine w reflex microscopic  HYPERLIPIDEMIA - Plan: Lipid panel: go back on his pravachol  HYPERTENSION - Plan: Basic metabolic panel: stable  Other postablative hypothyroidism - Plan: TSH, T3, free, T4, free; i am going to suggest he drop back on his synthroid a little bit to 112 mcg.  GRAVES' DISEASE  Encounter for long-term (current) use of other medications - Plan: CBC with Differential, Hepatic function panel  TOBACCO ABUSE: 5 minutes spent in counselling: The patient does smoke cigarettes, and we discussed that tobacco is harmful to one's overall health, and I made the recommendation to quit  tobacco products.   Health Maintenance Exam: The patient's preventative maintenance and recommended screening tests for an annual wellness exam were reviewed in full today. Brought up to date unless services declined.  Counselled on the importance of diet, exercise, and its role in overall health and mortality. The patient's FH and SH was reviewed, including their home life, tobacco status, and drug and alcohol status.  Follow-up: Return in about 1 year (around 10/30/2014). Unless noted, follow-up in 1 year for Health Maintenance Exam.  New Prescriptions   No medications on file   Modified Medications   No medications on file   Orders Placed This Encounter  Procedures  . TSH  . T3, free  . T4, free  . Basic metabolic panel  . CBC with Differential  . Lipid panel  . Hepatic function panel  . Urinalysis, Routine w reflex microscopic    Signed,  Terrace Chiem T. Hutson Luft, MD, CAQ Sports Medicine   Discontinued Medications   No medications on file   Current Medications at Discharge:   Medication List       This list is accurate as of: 10/29/13 11:59 PM.  Always use your most recent med list.               hydrochlorothiazide 25 MG tablet  Commonly known as:  HYDRODIURIL  Take one tablet by mouth in the morning     levothyroxine 125 MCG tablet  Commonly known as:  SYNTHROID, LEVOTHROID  Take 1 tablet by mouth once daily. Patient needs officeVisit.     pravastatin 40 MG tablet  Commonly known as:  PRAVACHOL  Take 0.5 tablets (20 mg total) by mouth daily.

## 2013-10-29 NOTE — Telephone Encounter (Signed)
Relevant patient education mailed to patient.  

## 2013-10-29 NOTE — Progress Notes (Signed)
Pre visit review using our clinic review tool, if applicable. No additional management support is needed unless otherwise documented below in the visit note. 

## 2013-11-05 ENCOUNTER — Other Ambulatory Visit: Payer: Self-pay | Admitting: Endocrinology

## 2013-11-05 ENCOUNTER — Encounter: Payer: Self-pay | Admitting: Family Medicine

## 2013-11-05 ENCOUNTER — Other Ambulatory Visit: Payer: Self-pay | Admitting: Family Medicine

## 2013-11-05 MED ORDER — PRAVASTATIN SODIUM 40 MG PO TABS: 20.0000 mg | ORAL_TABLET | Freq: Every day | ORAL | Status: DC

## 2013-11-05 MED ORDER — LEVOTHYROXINE SODIUM 125 MCG PO TABS: ORAL_TABLET | ORAL | Status: DC

## 2013-11-05 MED ORDER — HYDROCHLOROTHIAZIDE 25 MG PO TABS: ORAL_TABLET | ORAL | Status: DC

## 2013-12-08 ENCOUNTER — Encounter: Payer: Self-pay | Admitting: Family Medicine

## 2013-12-08 MED ORDER — LEVOTHYROXINE SODIUM 125 MCG PO TABS: ORAL_TABLET | ORAL | Status: DC

## 2013-12-08 NOTE — Telephone Encounter (Signed)
Can you send 1 years worth

## 2013-12-08 NOTE — Telephone Encounter (Signed)
Your refill has been sent to the pharmacy per your request.

## 2013-12-08 NOTE — Telephone Encounter (Signed)
See last thyroid test and confirm that patient's dose needs to be changed?

## 2014-01-11 ENCOUNTER — Emergency Department (HOSPITAL_COMMUNITY): Payer: BC Managed Care – PPO

## 2014-01-11 ENCOUNTER — Inpatient Hospital Stay (HOSPITAL_COMMUNITY)
Admission: EM | Admit: 2014-01-11 | Discharge: 2014-01-13 | DRG: 066 | Disposition: A | Discharge status: Home / Self Care | Payer: BC Managed Care – PPO | Attending: Family Medicine | Admitting: Family Medicine

## 2014-01-11 ENCOUNTER — Encounter (HOSPITAL_COMMUNITY): Payer: Self-pay | Admitting: Emergency Medicine

## 2014-01-11 DIAGNOSIS — E785 Hyperlipidemia, unspecified: Secondary | ICD-10-CM | POA: Diagnosis present

## 2014-01-11 DIAGNOSIS — F17200 Nicotine dependence, unspecified, uncomplicated: Secondary | ICD-10-CM | POA: Diagnosis present

## 2014-01-11 DIAGNOSIS — E039 Hypothyroidism, unspecified: Secondary | ICD-10-CM | POA: Diagnosis present

## 2014-01-11 DIAGNOSIS — I10 Essential (primary) hypertension: Secondary | ICD-10-CM | POA: Diagnosis present

## 2014-01-11 DIAGNOSIS — R200 Anesthesia of skin: Secondary | ICD-10-CM

## 2014-01-11 DIAGNOSIS — E890 Postprocedural hypothyroidism: Secondary | ICD-10-CM

## 2014-01-11 DIAGNOSIS — I6350 Cerebral infarction due to unspecified occlusion or stenosis of unspecified cerebral artery: Secondary | ICD-10-CM | POA: Diagnosis present

## 2014-01-11 DIAGNOSIS — I639 Cerebral infarction, unspecified: Secondary | ICD-10-CM | POA: Diagnosis present

## 2014-01-11 DIAGNOSIS — R209 Unspecified disturbances of skin sensation: Secondary | ICD-10-CM | POA: Diagnosis present

## 2014-01-11 DIAGNOSIS — Z79899 Other long term (current) drug therapy: Secondary | ICD-10-CM

## 2014-01-11 DIAGNOSIS — I6789 Other cerebrovascular disease: Secondary | ICD-10-CM | POA: Diagnosis present

## 2014-01-11 DIAGNOSIS — R202 Paresthesia of skin: Secondary | ICD-10-CM

## 2014-01-11 LAB — CBC WITH DIFFERENTIAL/PLATELET
BASOS PCT: 0 % (ref 0–1)
Basophils Absolute: 0 10*3/uL (ref 0.0–0.1)
Eosinophils Absolute: 0.1 10*3/uL (ref 0.0–0.7)
Eosinophils Relative: 1 % (ref 0–5)
HCT: 43.2 % (ref 39.0–52.0)
Hemoglobin: 14.5 g/dL (ref 13.0–17.0)
LYMPHS ABS: 1.7 10*3/uL (ref 0.7–4.0)
Lymphocytes Relative: 40 % (ref 12–46)
MCH: 27.6 pg (ref 26.0–34.0)
MCHC: 33.6 g/dL (ref 30.0–36.0)
MCV: 82.3 fL (ref 78.0–100.0)
Monocytes Absolute: 0.6 10*3/uL (ref 0.1–1.0)
Monocytes Relative: 13 % — ABNORMAL HIGH (ref 3–12)
Neutro Abs: 1.9 10*3/uL (ref 1.7–7.7)
Neutrophils Relative %: 46 % (ref 43–77)
PLATELETS: 192 10*3/uL (ref 150–400)
RBC: 5.25 MIL/uL (ref 4.22–5.81)
RDW: 16.5 % — ABNORMAL HIGH (ref 11.5–15.5)
WBC: 4.2 10*3/uL (ref 4.0–10.5)

## 2014-01-11 LAB — BASIC METABOLIC PANEL
Anion gap: 13 (ref 5–15)
BUN: 12 mg/dL (ref 6–23)
CO2: 23 mEq/L (ref 19–32)
Calcium: 9.2 mg/dL (ref 8.4–10.5)
Chloride: 103 mEq/L (ref 96–112)
Creatinine, Ser: 0.99 mg/dL (ref 0.50–1.35)
GLUCOSE: 77 mg/dL (ref 70–99)
POTASSIUM: 4.3 meq/L (ref 3.7–5.3)
SODIUM: 139 meq/L (ref 137–147)

## 2014-01-11 NOTE — ED Provider Notes (Signed)
CSN: [id number]     Arrival date & time 01/11/14  1138 History   First MD Initiated Contact with Patient 01/11/14 1238     Chief Complaint  Patient presents with  . Numbness     (Consider location/radiation/quality/duration/timing/severity/associated sxs/prior Treatment) HPI 53 year old male presents with numbness on the right side of his body since 4 days ago. He states that he noticed numbness from his forearm down to his fingers, the right side of his abdomen, and his right leg from knee to toes. Denies a weakness associated with this. Denies any headaches, blurry vision, facial symptoms, slurred speech, or facial droop. No injuries prior to this. Is otherwise feeling well prior to noticing the symptoms. Patient states the tingling and numbness is worst in his right great toe. Denies numbness in his right upper arm as well as his right thigh. No bowel or bladder incontinence. The patient has a history of neck pain in his mid neck and right lateral neck this has been there for a year. He rates the pain as mild currently rating as a 2/10 and states is no different than it typically is. Symptoms do not change with turning his head/neck.  Past Medical History  Diagnosis Date  . GRAVES' DISEASE 07/28/2009  . HYPERTHYROIDISM 07/27/2009  . HYPERLIPIDEMIA 07/05/2008  . TOBACCO ABUSE 04/23/2008  . HYPERTENSION 04/23/2008  . BACK PAIN 04/23/2008  . Other malaise and fatigue 06/29/2009  . Proteinuria 01/25/2009  . 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 Blood transfusion 1993    after hemorrhoid surgery  . Recurrent kidney stones    Past Surgical History  Procedure Laterality Date  . Relocation of left undescended testicle at age 26    . Hemorrhoid surgery  1993  . Av fistula repair  1993  . Small intestine surgery      remote bowel surgeries >15 years ago   Family History  Problem Relation Age of Onset  . Graves' disease Daughter    History  Substance Use Topics  . Smoking status:  Current Every Day Smoker -- 0.80 packs/day    Types: Cigarettes  . Smokeless tobacco: Not on file  . Alcohol Use: 1.8 oz/week    3 Cans of beer per week     Comment: occ    Review of Systems  Constitutional: Negative for fever.  Respiratory: Negative for shortness of breath.   Cardiovascular: Negative for chest pain.  Gastrointestinal: Negative for vomiting.  Musculoskeletal: Positive for neck pain. Negative for back pain.  Neurological: Positive for numbness. Negative for speech difficulty, weakness and headaches.  Psychiatric/Behavioral: Negative for confusion.  All other systems reviewed and are negative.     Allergies  Iodine  Home Medications   Prior to Admission medications   Medication Sig Start Date End Date Taking? Authorizing Provider  hydrochlorothiazide (HYDRODIURIL) 25 MG tablet Take 25 mg by mouth daily.   Yes Historical Provider, MD  levothyroxine (SYNTHROID, LEVOTHROID) 125 MCG tablet Take 125 mcg by mouth daily before breakfast.   Yes Historical Provider, MD  pravastatin (PRAVACHOL) 40 MG tablet Take 0.5 tablets (20 mg total) by mouth daily. 11/05/13  Yes Spencer Copland, MD   BP 157/118  Pulse 88  Temp(Src) 98 F (36.7 C) (Oral)  Resp 18  SpO2 100% Physical Exam  Nursing note and vitals reviewed. Constitutional: He is oriented to person, place, and time. He appears well-developed and well-nourished.  HENT:  Head: Normocephalic and atraumatic.  Right Ear: External ear normal.  Left Ear: External  ear normal.  Nose: Nose normal.  Eyes: EOM are normal. Pupils are equal, round, and reactive to light. Right eye exhibits no discharge. Left eye exhibits no discharge.  Neck: Normal range of motion. Neck supple. Muscular tenderness (mild mildline neck tenderness) present.  Cardiovascular: Normal rate, regular rhythm, normal heart sounds and intact distal pulses.   Pulmonary/Chest: Effort normal.  Abdominal: Soft. There is no tenderness.  Musculoskeletal: He  exhibits no edema.  Neurological: He is alert and oriented to person, place, and time.  CN 2-12 grossly intact. 5/5 strength in all 4 extremities. Normal finger to nose. Patient has equal sensation to light touch in all 4 extremities. Able to differentiate sharp and light touch. States he feels all of this but the right forearm/hand, lower leg/foot are still "numb".  Skin: Skin is warm and dry.    ED Course  Procedures (including critical care time) Labs Review Labs Reviewed  CBC WITH DIFFERENTIAL - Abnormal; Notable for the following:    RDW 16.5 (*)    Monocytes Relative 13 (*)    All other components within normal limits  BASIC METABOLIC PANEL    Imaging Review Dg Cervical Spine Complete  01/11/2014   CLINICAL DATA:  Right upper extremity numbness and tingling which began approximately 3 days ago. No recent injuries.  EXAM: CERVICAL SPINE  4+ VIEWS  COMPARISON:  None.  FINDINGS: Reversal of the usual cervical lordosis centered at C5-6. Anatomic posterior alignment. No visible fractures. Disc space narrowing and endplate hypertrophic changes at every level from C4-5 through T1 to, greatest at C7-T1. Facet joints intact. Moderate right foraminal stenoses at C5-6, C6-7 and C7-T1 and moderate left foraminal stenosis on the left at C7-T1. No static evidence of instability. Bilateral carotid atherosclerosis noted.  IMPRESSION: Reversal of the usual lordosis centered at C5-6, likely reflecting positioning and/or spasm. Multilevel degenerative disc disease and spondylosis, greatest at C7-T1. Right foraminal stenoses at C5-6, C6-7 and C7-T1 and left foraminal stenosis at C7-T1.   Electronically Signed   By: Hulan Saas M.D.   On: 01/11/2014 13:36   Ct Head Wo Contrast  01/11/2014   CLINICAL DATA:  Four days of right-sided tingling and numbness  EXAM: CT HEAD WITHOUT CONTRAST  TECHNIQUE: Contiguous axial images were obtained from the base of the skull through the vertex without intravenous  contrast.  COMPARISON:  None.  FINDINGS: The ventricles are normal in size and position. There is no intracranial hemorrhage nor intracranial mass effect. There is no acute ischemic change. The cerebellum and brainstem are unremarkable.  The observed paranasal sinuses and mastoid air cells are clear. There is no acute skull fracture.  IMPRESSION: There is no acute intracranial hemorrhage nor acute ischemic change nor other acute intracranial abnormality.   Electronically Signed   By: David  [place]   On: 01/11/2014 13:23     EKG Interpretation   Date/Time:  Sunday January 11 2014 11:53:36 EDT Ventricular Rate:  88 PR Interval:  152 QRS Duration: 90 QT Interval:  393 QTC Calculation: 475 R Axis:   106 Text Interpretation:  Right and left arm electrode reversal,  interpretation assumes no reversal Sinus or ectopic atrial rhythm Right  axis deviation Nonspecific T abnormalities, lateral leads Borderline  prolonged QT interval Baseline wander in lead(s) V2 No old tracing to  compare Confirmed by Emmanuel Gruenhagen  MD, Kaleia Longhi ([id number]) on 01/11/2014 1:11:35 PM      MDM   Final diagnoses:  Numbness and tingling    Patient with nonspecific  numbness and tingling of the right side of his body. This does not followed obvious anatomical distribution. Initial workup is negative. I discussed the case with the neurologist on call, Dr. Roseanne Reno, who recommends MRI brain. Given his chronic neck pain we'll also get his neck to rule out spinal cord issue causing the symptoms as well. Care transferred to Dr. Micheline Maze with MRI pending. If negative, patient is to f/u with neurology as outpatient.     Audree Camel, MD 01/11/14 [phone]

## 2014-01-11 NOTE — Consult Note (Signed)
Neurology Consultation Reason for Consult: Stroke Referring Physician: Nonda Lou  CC: Right arm and leg numbness.   History is obtained from: Patient and wife  HPI: Fernando Bell is a 53 y.o. male with a history of hypertension, hyperlipidemia, smoking who presents with right-sided numbness that started several days ago. He states that he was walking into his house and felt his right leg and arm go numb all of a sudden. He has a little bit of facial involvement, but not much. Since that time, it has been persistent, and he noticed some tingling today and therefore came to the emergency room for further evaluation.  In the emergency room, an MRI was obtained which shows a small acute infarction in the left thalamus.   LKW: 9/16 tpa given?: no, outside of window    ROS: A 14 point ROS was performed and is negative except as noted in the HPI.   Past Medical History  Diagnosis Date  . GRAVES' DISEASE 07/28/2009  . HYPERTHYROIDISM 07/27/2009  . HYPERLIPIDEMIA 07/05/2008  . TOBACCO ABUSE 04/23/2008  . HYPERTENSION 04/23/2008  . BACK PAIN 04/23/2008  . Other malaise and fatigue 06/29/2009  . Proteinuria 01/25/2009  . 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 Blood transfusion 1993    after hemorrhoid surgery  . Recurrent kidney stones     Family History: Sister-aneurysm  Social History: Tob: Current everyday smoker, the patient was counseled to stop smoking  Exam: Current vital signs: BP 139/83  Pulse 80  Temp(Src) 98.2 F (36.8 C) (Oral)  Resp 18  SpO2 100% Vital signs in last 24 hours: Temp:  [98 F (36.7 C)-98.2 F (36.8 C)] 98.2 F (36.8 C) (09/20 2020) Pulse Rate:  [80-88] 80 (09/20 2020) Resp:  [16-18] 18 (09/20 2020) BP: (139-157)/(83-118) 139/83 mmHg (09/20 2020) SpO2:  [100 %] 100 % (09/20 2020)  General: In bed, NAD CV: Regular rate and rhythm Mental Status: Patient is awake, alert, oriented to person, place, month, year, and situation. Immediate and  remote memory are intact. Patient is able to give a clear and coherent history. No signs of aphasia or neglect Cranial Nerves: II: Visual Fields are full. Pupils are equal, round, and reactive to light.  Discs are sharp. III,IV, VI: EOMI without ptosis or diploplia.  V: Facial sensation is symmetric to pinprick VII: Facial movement is symmetric.  VIII: hearing is intact to voice X: Uvula elevates symmetrically XI: Shoulder shrug is symmetric. XII: tongue is midline without atrophy or fasciculations.  Motor: Tone is normal. Bulk is normal. 5/5 strength was present in all four extremities.  Sensory: Sensation is mildly diminished in the right arm and leg to pinprick Deep Tendon Reflexes: 2+ and symmetric in the biceps and patellae.  Plantars: Toes are downgoing bilaterally.  Cerebellar: FNF and HKS are intact bilaterally Gait: Not assessed due to  multiple medical monitors in ED setting.  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 Likely due to small vessel disease, will need risk  factor assessment and modification.  Recommendations: 1. HgbA1c, fasting lipid panel 2. Frequent neuro checks 3. Echocardiogram 4. Carotid dopplers 5. Prophylactic therapy-Antiplatelet med: Aspirin - dose  PO or  PR 6. Risk factor modification 7. Telemetry monitoring 8. PT consult, OT consult, Speech consult    Ritta Slot, MD [facility] [phone]  If 7pm- 7am, please  page neurology on call as listed in [place].

## 2014-01-11 NOTE — Discharge Instructions (Signed)

## 2014-01-11 NOTE — ED Notes (Signed)
Gone to MRI 

## 2014-01-11 NOTE — ED Notes (Signed)
Pt has been c/o rt sided numbness and tingling x 3 days.  Rt elbow down to fingers, rt side of abd, rt all 5 toes.  Sensation intact in places where he is c/o numbness.  Not c/o pain.  Pt ambulated to room with no assistance and steady gait.  No slurred speech.  Equal grip strength.

## 2014-01-11 NOTE — H&P (Signed)
[facility] History and Physical  Fernando Bell GNF:[id number] DOB: [birth year] DOA: 01/11/2014  Referring physician: ER physician. PCP: Hannah Beat, MD  Chief Complaint: Right-sided numbness.  HPI: Fernando Bell is a 53 y.o. male with history of hypertension, hyperlipidemia, ongoing tobacco abuse, Graves' disease status post ablation presents to the ER because of persistent numbness of the right upper and lower extremities. Patient's symptoms started on Wednesday 4 days ago with numbness of the right lower extremity and eventually spreading to his right side of his trunk and upper extremity. Denies any associated weakness of the upper lower extremities or any visual symptoms nausea vomiting dizziness loss of consciousness difficulty speaking or swallowing. Since the symptoms were persistent he came to the ER an MRI of the brain done shows left thalamic stroke. On-call neurologist Dr. Amada Jupiter was consulted and patient will be admitted for further management. Patient will be transferred to [facility] for further management and patient is in agreement. Patient denies any chest pain shortness of breath fever chills productive cough.   Review of Systems: As presented in the history of presenting illness, rest negative.  Past Medical History  Diagnosis Date  . GRAVES' DISEASE 07/28/2009  . HYPERTHYROIDISM 07/27/2009  . HYPERLIPIDEMIA 07/05/2008  . TOBACCO ABUSE 04/23/2008  . HYPERTENSION 04/23/2008  . BACK PAIN 04/23/2008  . Other malaise and fatigue 06/29/2009  . Proteinuria 01/25/2009  . 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 Blood transfusion 1993    after hemorrhoid surgery  . Recurrent kidney stones    Past Surgical History  Procedure Laterality Date  . Relocation of left undescended testicle at age 8    . Hemorrhoid surgery  1993  . Av fistula repair  1993  . Small intestine surgery      remote bowel surgeries >15 years ago   Social History:  reports that he has been  smoking Cigarettes.  He has been smoking about 0.80 packs per day. He does not have any smokeless tobacco history on file. He reports that he drinks about 1.8 ounces of alcohol per week. He reports that he does not use illicit drugs. Where does patient live home. Can patient participate in ADLs? Yes.  Allergies  Allergen Reactions  . Iodine Shortness Of Breath    Family History:  Family History  Problem Relation Age of Onset  . Graves' disease Daughter   . Stroke Sister       Prior to Admission medications   Medication Sig Start Date End Date Taking? Authorizing Provider  hydrochlorothiazide (HYDRODIURIL) 25 MG tablet Take 25 mg by mouth daily.   Yes Historical Provider, MD  levothyroxine (SYNTHROID, LEVOTHROID) 125 MCG tablet Take 125 mcg by mouth daily before breakfast.   Yes Historical Provider, MD  pravastatin (PRAVACHOL) 40 MG tablet Take 0.5 tablets (20 mg total) by mouth daily. 11/05/13  Yes Hannah Beat, MD    Physical Exam: Filed Vitals:   01/11/14 1149 01/11/14 1522 01/11/14 2020  BP: 157/118 140/90 139/83  Pulse: 88 82 80  Temp: 98 F (36.7 C)  98.2 F (36.8 C)  TempSrc: Oral  Oral  Resp: SpO2: 100% 100% 100%     General:  Well-developed and nourished.  Eyes: Anicteric no pallor.  ENT: No discharge from ears eyes nose mouth.  Neck: No mass felt.  Cardiovascular: S1-S2 heard.  Respiratory: No rhonchi or crepitations.  Abdomen: Soft nontender bowel sounds present.  Skin: No rash.  Musculoskeletal: No edema.  Psychiatric: Appears normal.  Neurologic:  Alert awake oriented to time place and person. Moves all extremities 5 x 5. No facial asymmetry. Tongue is midline. PERRLA positive.  Labs on Admission:  Basic Metabolic Panel:  Recent Labs Lab 01/11/14 1304  NA 139  K 4.3  CL 103  CO2 23  GLUCOSE 77  BUN 12  CREATININE 0.99  CALCIUM 9.2   Liver Function Tests: No results found for this basename: AST, ALT, ALKPHOS, BILITOT,  PROT, ALBUMIN,  in the last 168 hours No results found for this basename: LIPASE, AMYLASE,  in the last 168 hours No results found for this basename: AMMONIA,  in the last 168 hours CBC:  Recent Labs Lab 01/11/14 1304  WBC 4.2  NEUTROABS 1.9  HGB 14.5  HCT 43.2  MCV 82.3  PLT 192   Cardiac Enzymes: No results found for this basename: CKTOTAL, CKMB, CKMBINDEX, TROPONINI,  in the last 168 hours  BNP (last 3 results) No results found for this basename: PROBNP,  in the last 8760 hours CBG: No results found for this basename: GLUCAP,  in the last 168 hours  Radiological Exams on Admission: Dg Cervical Spine Complete  01/11/2014   CLINICAL DATA:  Right upper extremity numbness and tingling which began approximately 3 days ago. No recent injuries.  EXAM: CERVICAL SPINE  4+ VIEWS  COMPARISON:  None.  FINDINGS: Reversal of the usual cervical lordosis centered at C5-6. Anatomic posterior alignment. No visible fractures. Disc space narrowing and endplate hypertrophic changes at every level from C4-5 through T1 to, greatest at C7-T1. Facet joints intact. Moderate right foraminal stenoses at C5-6, C6-7 and C7-T1 and moderate left foraminal stenosis on the left at C7-T1. No static evidence of instability. Bilateral carotid atherosclerosis noted.  IMPRESSION: Reversal of the usual lordosis centered at C5-6, likely reflecting positioning and/or spasm. Multilevel degenerative disc disease and spondylosis, greatest at C7-T1. Right foraminal stenoses at C5-6, C6-7 and C7-T1 and left foraminal stenosis at C7-T1.   Electronically Signed   By: Hulan Saas M.D.   On: 01/11/2014 13:36   Ct Head Wo Contrast  01/11/2014   CLINICAL DATA:  Four days of right-sided tingling and numbness  EXAM: CT HEAD WITHOUT CONTRAST  TECHNIQUE: Contiguous axial images were obtained from the base of the skull through the vertex without intravenous contrast.  COMPARISON:  None.  FINDINGS: The ventricles are normal in size and  position. There is no intracranial hemorrhage nor intracranial mass effect. There is no acute ischemic change. The cerebellum and brainstem are unremarkable.  The observed paranasal sinuses and mastoid air cells are clear. There is no acute skull fracture.  IMPRESSION: There is no acute intracranial hemorrhage nor acute ischemic change nor other acute intracranial abnormality.   Electronically Signed   By: David  [place]   On: 01/11/2014 13:23   Mr Brain Wo Contrast  01/11/2014   CLINICAL DATA:  Numbness of the right side of the body. Pain in the right arm and hand.  EXAM: MRI HEAD WITHOUT CONTRAST  MRI CERVICAL SPINE WITHOUT CONTRAST  TECHNIQUE: Multiplanar, multiecho pulse sequences of the brain and surrounding structures, and cervical spine, to include the craniocervical junction and cervicothoracic junction, were obtained without intravenous contrast.  COMPARISON:  Head CT same day.  FINDINGS: MRI HEAD FINDINGS  Diffusion imaging shows a 3 mm acute infarction in the lateral thalamus on the left. No other acute infarction.  The brainstem and cerebellum are normal. The cerebral hemispheres otherwise show moderate chronic small-vessel ischemic changes within the deep  and subcortical white matter. No cortical or large vessel territory infarction. No mass lesion, hemorrhage, hydrocephalus or extra-axial collection. No pituitary mass. No inflammatory sinus disease. No skull or skullbase lesion.  MRI CERVICAL SPINE FINDINGS  The foramen magnum is widely patent. There is ordinary osteoarthritis of the C1-2 articulation but no encroachment upon the neural spaces.  C2-3:  Normal interspace.  C3-4: There is facet arthropathy right worse than left with anterolisthesis of 2 mm. There is facet joint edema on the right, likely to be associated with pain. No compressive stenosis of the central canal. Foraminal narrowing right more than left could possibly affect the C4 nerve roots.  C4-5: Disc bulge. No cord compression. Mild  foraminal stenosis bilaterally.  C5-6: Bilateral uncovertebral prominence with bilateral foraminal narrowing. No central canal stenosis.  C6-7: Endplate osteophytes. Narrowing of the ventral subarachnoid space but no compression of the cord. Bilateral foraminal narrowing.  C7-T1: Spondylosis with endplate osteophytes and protruding disc material. Effacement of the subarachnoid space but no compression of the cord. Foraminal stenosis bilaterally that could compress either or both C8 nerve roots.  T1-2: Disc bulge. No cord compression. Foraminal stenosis on the left that could affect the T1 nerve root.  No abnormal cord signal.  IMPRESSION: MRI brain: Acute 3 mm infarction affecting the lateral thalamus on the left. Moderate chronic small-vessel changes elsewhere as outlined above.  MRI cervical spine: Complex multilevel degenerative disease. No cord compression. Canal stenosis is most pronounced at C7-T1 with the subarachnoid space is effaced but the cord is not deformed and there is no cord edema.  Facet arthropathy on the right at C3-4 that could be focally painful.  Foraminal stenosis bilaterally in the region from C3-4 through C7-T1. The exiting nerve roots could be compressed or irritated as they exit the narrowed foramina. Foraminal stenosis is most severe bilaterally at C7-T1.   Electronically Signed   By: Paulina Fusi M.D.   On: 01/11/2014 20:24   Mr Cervical Spine Wo Contrast  01/11/2014   CLINICAL DATA:  Numbness of the right side of the body. Pain in the right arm and hand.  EXAM: MRI HEAD WITHOUT CONTRAST  MRI CERVICAL SPINE WITHOUT CONTRAST  TECHNIQUE: Multiplanar, multiecho pulse sequences of the brain and surrounding structures, and cervical spine, to include the craniocervical junction and cervicothoracic junction, were obtained without intravenous contrast.  COMPARISON:  Head CT same day.  FINDINGS: MRI HEAD FINDINGS  Diffusion imaging shows a 3 mm acute infarction in the lateral thalamus on the  left. No other acute infarction.  The brainstem and cerebellum are normal. The cerebral hemispheres otherwise show moderate chronic small-vessel ischemic changes within the deep and subcortical white matter. No cortical or large vessel territory infarction. No mass lesion, hemorrhage, hydrocephalus or extra-axial collection. No pituitary mass. No inflammatory sinus disease. No skull or skullbase lesion.  MRI CERVICAL SPINE FINDINGS  The foramen magnum is widely patent. There is ordinary osteoarthritis of the C1-2 articulation but no encroachment upon the neural spaces.  C2-3:  Normal interspace.  C3-4: There is facet arthropathy right worse than left with anterolisthesis of 2 mm. There is facet joint edema on the right, likely to be associated with pain. No compressive stenosis of the central canal. Foraminal narrowing right more than left could possibly affect the C4 nerve roots.  C4-5: Disc bulge. No cord compression. Mild foraminal stenosis bilaterally.  C5-6: Bilateral uncovertebral prominence with bilateral foraminal narrowing. No central canal stenosis.  C6-7: Endplate osteophytes. Narrowing of the  ventral subarachnoid space but no compression of the cord. Bilateral foraminal narrowing.  C7-T1: Spondylosis with endplate osteophytes and protruding disc material. Effacement of the subarachnoid space but no compression of the cord. Foraminal stenosis bilaterally that could compress either or both C8 nerve roots.  T1-2: Disc bulge. No cord compression. Foraminal stenosis on the left that could affect the T1 nerve root.  No abnormal cord signal.  IMPRESSION: MRI brain: Acute 3 mm infarction affecting the lateral thalamus on the left. Moderate chronic small-vessel changes elsewhere as outlined above.  MRI cervical spine: Complex multilevel degenerative disease. No cord compression. Canal stenosis is most pronounced at C7-T1 with the subarachnoid space is effaced but the cord is not deformed and there is no cord  edema.  Facet arthropathy on the right at C3-4 that could be focally painful.  Foraminal stenosis bilaterally in the region from C3-4 through C7-T1. The exiting nerve roots could be compressed or irritated as they exit the narrowed foramina. Foraminal stenosis is most severe bilaterally at C7-T1.   Electronically Signed   By: Paulina Fusi M.D.   On: 01/11/2014 20:24    EKG: Independently reviewed. Normal sinus rhythm.  Assessment/Plan Principal Problem:   CVA (cerebral infarction) Active Problems:   HYPERLIPIDEMIA   HYPERTENSION   1. CVA - patient has been placed on neurochecks and swallow evaluation. Check MRI brain 2-D echo and carotid Dopplers. Aspirin. Patient advised to quit smoking. 2. Hyperlipidemia - on statins. 3. Hypertension - at this time since patient is getting gentle hydration HCTZ: Patient is on when necessary IV hydralazine. 4. Tobacco abuse - patient strongly advised to quit smoking. 5. History of Graves' disease status post ablation on Synthroid therapy.  Chest x-ray is pending.  Code Status: Full code.  Family Communication: Wife at the bedside.  Disposition Plan: Admit to inpatient. Patient will be transferred to [facility]. Dr. Lyda Perone will be accepting physician.    Fernando Bell N. [facility] Pager [phone].  If 7PM-7AM, please contact night-coverage [email or web address] Password [email or web address] 01/11/2014, 10:06 PM

## 2014-01-11 NOTE — ED Provider Notes (Addendum)
Care transferred from Dr. Criss Alvine with 4 days of right sided numbness/paresthesias. CT head was unremarkable. MRI pending at time of checkout. MRI results show an acute left thalamic infarct spoke with neurology who would like patient to be transferred to [facility]. [facility] for admission. Patient & wife made aware of MRI findings.   1. Cerebral infarction due to unspecified mechanism   2. Numbness and tingling       Toy Cookey, MD 01/11/14 2120  Toy Cookey, MD 01/11/14 2120

## 2014-01-11 NOTE — ED Notes (Signed)
MD at bedside talking with pt/family about options.

## 2014-01-11 NOTE — ED Notes (Signed)
Numbness on Right side below neck.  BP now 135/95, at 16:10960/454.  Pt sts BP not normally this high and has White Coat syndrome.

## 2014-01-12 ENCOUNTER — Inpatient Hospital Stay (HOSPITAL_COMMUNITY): Payer: BC Managed Care – PPO

## 2014-01-12 DIAGNOSIS — I517 Cardiomegaly: Secondary | ICD-10-CM

## 2014-01-12 LAB — COMPREHENSIVE METABOLIC PANEL
ALK PHOS: 64 U/L (ref 39–117)
ALT: 24 U/L (ref 0–53)
AST: 22 U/L (ref 0–37)
Albumin: 3.7 g/dL (ref 3.5–5.2)
Anion gap: 13 (ref 5–15)
BILIRUBIN TOTAL: 0.3 mg/dL (ref 0.3–1.2)
BUN: 14 mg/dL (ref 6–23)
CHLORIDE: 99 meq/L (ref 96–112)
CO2: 26 mEq/L (ref 19–32)
Calcium: 9.3 mg/dL (ref 8.4–10.5)
Creatinine, Ser: 1.02 mg/dL (ref 0.50–1.35)
GFR calc non Af Amer: 82 mL/min — ABNORMAL LOW (ref >90)
GLUCOSE: 130 mg/dL — AB (ref 70–99)
POTASSIUM: 4 meq/L (ref 3.7–5.3)
SODIUM: 138 meq/L (ref 137–147)
TOTAL PROTEIN: 7 g/dL (ref 6.0–8.3)

## 2014-01-12 LAB — CBC WITH DIFFERENTIAL/PLATELET
BASOS PCT: 1 % (ref 0–1)
Basophils Absolute: 0 10*3/uL (ref 0.0–0.1)
EOS ABS: 0.1 10*3/uL (ref 0.0–0.7)
Eosinophils Relative: 1 % (ref 0–5)
HCT: 44.4 % (ref 39.0–52.0)
HEMOGLOBIN: 14.8 g/dL (ref 13.0–17.0)
Lymphocytes Relative: 39 % (ref 12–46)
Lymphs Abs: 1.5 10*3/uL (ref 0.7–4.0)
MCH: 27.7 pg (ref 26.0–34.0)
MCHC: 33.3 g/dL (ref 30.0–36.0)
MCV: 83.1 fL (ref 78.0–100.0)
MONO ABS: 0.5 10*3/uL (ref 0.1–1.0)
Monocytes Relative: 14 % — ABNORMAL HIGH (ref 3–12)
NEUTROS PCT: 45 % (ref 43–77)
Neutro Abs: 1.8 10*3/uL (ref 1.7–7.7)
Platelets: 188 10*3/uL (ref 150–400)
RBC: 5.34 MIL/uL (ref 4.22–5.81)
RDW: 16.5 % — ABNORMAL HIGH (ref 11.5–15.5)
WBC: 4 10*3/uL (ref 4.0–10.5)

## 2014-01-12 LAB — LIPID PANEL
CHOL/HDL RATIO: 2.9 ratio
Cholesterol: 217 mg/dL — ABNORMAL HIGH (ref 0–200)
HDL: 76 mg/dL (ref >39)
LDL CALC: 123 mg/dL — AB (ref 0–99)
Triglycerides: 88 mg/dL (ref <150)
VLDL: 18 mg/dL (ref 0–40)

## 2014-01-12 LAB — TSH: TSH: 1.26 u[IU]/mL (ref 0.350–4.500)

## 2014-01-12 LAB — HEMOGLOBIN A1C
HEMOGLOBIN A1C: 5.7 % — AB (ref <5.7)
Mean Plasma Glucose: 117 mg/dL — ABNORMAL HIGH (ref <117)

## 2014-01-12 MED ORDER — ASPIRIN 300 MG RE SUPP
300.0000 mg | Freq: Every day | RECTAL | Status: DC
  Filled 2014-01-12 (×2): qty 1

## 2014-01-12 MED ORDER — SIMVASTATIN 5 MG PO TABS: 5.0000 mg | ORAL_TABLET | Freq: Every day | ORAL | Status: DC

## 2014-01-12 MED ORDER — PRAVASTATIN SODIUM 20 MG PO TABS
20.0000 mg | ORAL_TABLET | Freq: Every day | ORAL | Status: DC
  Administered 2014-01-12 – 2014-01-13 (×2): 20 mg via ORAL
  Filled 2014-01-12 (×2): qty 1

## 2014-01-12 MED ORDER — SENNOSIDES-DOCUSATE SODIUM 8.6-50 MG PO TABS
1.0000 | ORAL_TABLET | Freq: Every evening | ORAL | Status: DC | PRN
  Filled 2014-01-12: qty 1

## 2014-01-12 MED ORDER — HYDRALAZINE HCL 20 MG/ML IJ SOLN: 10.0000 mg | INTRAMUSCULAR | Status: DC | PRN

## 2014-01-12 MED ORDER — ASPIRIN 325 MG PO TABS
325.0000 mg | ORAL_TABLET | Freq: Every day | ORAL | Status: DC
  Administered 2014-01-12 – 2014-01-13 (×2): 325 mg via ORAL
  Filled 2014-01-12 (×2): qty 1

## 2014-01-12 MED ORDER — SODIUM CHLORIDE 0.9 % IV SOLN
INTRAVENOUS | Status: AC
  Administered 2014-01-12: 01:00:00 via INTRAVENOUS

## 2014-01-12 MED ORDER — LEVOTHYROXINE SODIUM 125 MCG PO TABS
125.0000 ug | ORAL_TABLET | Freq: Every day | ORAL | Status: DC
  Administered 2014-01-12 – 2014-01-13 (×2): 125 ug via ORAL
  Filled 2014-01-12 (×3): qty 1

## 2014-01-12 MED ORDER — ENOXAPARIN SODIUM 40 MG/0.4ML ~~LOC~~ SOLN
40.0000 mg | SUBCUTANEOUS | Status: DC
  Administered 2014-01-12 – 2014-01-13 (×2): 40 mg via SUBCUTANEOUS
  Filled 2014-01-12 (×2): qty 0.4

## 2014-01-12 MED ORDER — TRAMADOL HCL 50 MG PO TABS
100.0000 mg | ORAL_TABLET | Freq: Four times a day (QID) | ORAL | Status: DC | PRN
  Administered 2014-01-12: 100 mg via ORAL
  Filled 2014-01-12: qty 2

## 2014-01-12 MED ORDER — STROKE: EARLY STAGES OF RECOVERY BOOK
Freq: Once | Status: AC
  Administered 2014-01-12: 01:00:00
  Filled 2014-01-12: qty 1

## 2014-01-12 NOTE — Progress Notes (Signed)
UR Completed Wrenn Willcox Graves-Bigelow, RN,BSN [phone]  

## 2014-01-12 NOTE — Evaluation (Signed)
Physical Therapy Evaluation/ Discharge Patient Details Name: Fernando Bell MRN: [id number] DOB: 1961/04/17 Today's Date: 01/12/2014   History of Present Illness  Fernando Bell is a 53 y.o. male with history of hypertension, hyperlipidemia, ongoing tobacco abuse, Graves' disease status post ablation presents to the ER because of persistent numbness of the right upper and lower extremities. Patient's symptoms started on Wednesday 4 days ago with numbness of the right lower extremity and eventually spreading to his right side of his trunk and upper extremity. Denies any associated weakness of the upper lower extremities or any visual symptoms nausea vomiting dizziness loss of consciousness difficulty speaking or swallowing. Since the symptoms were persistent he came to the ER an MRI of the brain done shows left thalamic stroke  Clinical Impression  Pt very pleasant with currently no symptoms other than decreased sensation of lateral aspect of left foot only. Pt with strength 5/5 all myotomes upper and lower body with ability to visually track without difficulty. 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 Pt without noted difficulty with balance or mobility. Pt educated for risk factors, signs and symptoms (BE FAST) with pt able verbalize end of session. Pt educated for need to inspect LLE end of day for any sign of injury due to impaired sensation and to be aware when driving. Pt reporting that is he is instructed not to drive he will not adhere to this instruction. Pt and wife present for all education and mobility. Pt at baseline mobility and function with all education provided and no further needs at this time. Will sign off with pt and spouse in agreement.    Follow Up Recommendations No PT follow up    Equipment Recommendations  None recommended by PT    Recommendations for Other Services       Precautions / Restrictions Precautions Precautions: None       Mobility  Bed Mobility Overal bed mobility: Independent                Transfers Overall transfer level: Independent                  Ambulation/Gait Ambulation/Gait assistance: Independent              Stairs Stairs: Yes Stairs assistance: Modified independent (Device/Increase time) Stair Management: One rail Right;Alternating pattern;Forwards Number of Stairs: 11    Wheelchair Mobility    Modified Rankin (Stroke Patients Only) Modified Rankin (Stroke Patients Only) Pre-Morbid Rankin Score: No symptoms Modified Rankin: No significant disability     Balance Overall balance assessment: Independent                                           Pertinent Vitals/Pain Pain Assessment: No/denies pain    Home Living Family/patient expects to be discharged to:: Private residence Living Arrangements: Spouse/significant other Available Help at Discharge: Family;Available 24 hours/day Type of Home: House Home Access: Level entry     Home Layout: Two level;Bed/bath upstairs Home Equipment: None      Prior Function Level of Independence: Independent               Hand Dominance        Extremity/Trunk Assessment   Upper Extremity Assessment: Overall WFL for tasks assessed           Lower Extremity Assessment:  Overall [facility] for tasks assessed      Cervical / Trunk Assessment: Normal  Communication   Communication: No difficulties  Cognition Arousal/Alertness: Awake/alert Behavior During Therapy: WFL for tasks assessed/performed Overall Cognitive Status: Within Functional Limits for tasks assessed                      General Comments      Exercises        Assessment/Plan    PT Assessment Patent does not need any further PT services  PT Diagnosis     PT Problem List    PT Treatment Interventions     PT Goals (Current goals can be found in the Care Plan section) Acute Rehab PT Goals PT Goal  Formulation: No goals set, d/c therapy    Frequency     Barriers to discharge        Co-evaluation               End of Session   Activity Tolerance: Patient tolerated treatment well Patient left: in bed;with call bell/phone within reach;with family/visitor present Nurse Communication: Mobility status         Time: 6578-4696 PT Time Calculation (min): 21 min   Charges:   PT Evaluation $Initial PT Evaluation Tier I: 1 Procedure PT Treatments $Self Care/Home Management: 8-22   PT G Codes:          Delorse Lek 01/12/2014, 11:59 AM Delaney Meigs, PT [phone]

## 2014-01-12 NOTE — Progress Notes (Signed)
PT Cancellation Note  Patient Details Name: Fernando Bell MRN: [id number] DOB: 1960-08-10   Cancelled Treatment:    Reason Eval/Treat Not Completed: Patient at procedure or test/unavailable (currently getting Echo)   Toney Sang [facility] 01/12/2014, 10:44 AM Delaney Meigs, PT [phone]

## 2014-01-12 NOTE — Progress Notes (Signed)
STROKE TEAM PROGRESS NOTE   HISTORY Fernando Bell is a 53 y.o. male with a history of hypertension, hyperlipidemia, smoking who presents with right-sided numbness that started several days ago. He states that he was walking into his house and felt his right leg and arm go numb all of a sudden. He has a little bit of facial involvement, but not much. Since that time, it has been persistent, and he noticed some tingling today and therefore came to the emergency room for further evaluation. In the emergency room, an MRI was obtained which shows a small acute infarction in the left thalamus.  LKW: 9/16 Patient was not administered TPA secondary to delay in arrival. He was admitted for further evaluation and treatment.   SUBJECTIVE (INTERVAL HISTORY) His  wife is at the bedside.  Overall he feels his condition is gradually improving. He still has some residual numbness.   OBJECTIVE Temp:  [98 F (36.7 C)-98.8 F (37.1 C)] 98.3 F (36.8 C) (09/21 0543) Pulse Rate:  [51-88] 55 (09/21 0543) Cardiac Rhythm:  [-] Sinus bradycardia (09/21 0014) Resp:  [16-18] 16 (09/21 0543) BP: (131-157)/(82-118) 137/85 mmHg (09/21 0543) SpO2:  [100 %] 100 % (09/21 0543) Weight:  [80.151 kg (176 lb 11.2 oz)] 80.151 kg (176 lb 11.2 oz) (09/21 0014)   Recent Labs Lab 01/11/14 1304  NA 139  K 4.3  CL 103  CO2 23  GLUCOSE 77  BUN 12  CREATININE 0.99  CALCIUM 9.2   No results found for this basename: AST, ALT, ALKPHOS, BILITOT, PROT, ALBUMIN,  in the last 168 hours  Recent Labs Lab 01/11/14 1304  WBC 4.2  NEUTROABS 1.9  HGB 14.5  HCT 43.2  MCV 82.3  PLT 192   No results found for this basename: CKTOTAL, CKMB, CKMBINDEX, TROPONINI,  in the last 168 hours No results found for this basename: LABPROT, INR,  in the last 72 hours No results found for this basename: COLORURINE, APPERANCEUR, LABSPEC, PHURINE, GLUCOSEU, HGBUR, BILIRUBINUR, KETONESUR, PROTEINUR, UROBILINOGEN, NITRITE, LEUKOCYTESUR,  in the  last 72 hours     Component Value Date/Time   CHOL 268* 10/29/2013 0929   TRIG 99.0 10/29/2013 0929   HDL 61.70 10/29/2013 0929   CHOLHDL 4 10/29/2013 0929   VLDL 19.8 10/29/2013 0929   LDLCALC 187* 10/29/2013 0929   No results found for this basename: HGBA1C   No results found for this basename: labopia, cocainscrnur, labbenz, amphetmu, thcu, labbarb    No results found for this basename: ETH,  in the last 168 hours  Dg Cervical Spine Complete 01/11/2014   Reversal of the usual lordosis centered at C5-6, likely reflecting positioning and/or spasm. Multilevel degenerative disc disease and spondylosis, greatest at C7-T1. Right foraminal stenoses at C5-6, C6-7 and C7-T1 and left foraminal stenosis at C7-T1.     Ct Head Wo Contrast 01/11/2014   There is no acute intracranial hemorrhage nor acute ischemic change nor other acute intracranial abnormality.     Mr Brain Wo Contrast 01/11/2014    Acute 3 mm infarction affecting the lateral thalamus on the left. Moderate chronic small-vessel changes elsewhere as outlined above.    Mr Cervical Spine Wo Contrast 01/11/2014   Complex multilevel degenerative disease. No cord compression. Canal stenosis is most pronounced at C7-T1 with the subarachnoid space is effaced but the cord is not deformed and there is no cord edema.  Facet arthropathy on the right at C3-4 that could be focally painful.  Foraminal stenosis bilaterally in the region  from C3-4 through C7-T1. The exiting nerve roots could be compressed or irritated as they exit the narrowed foramina. Foraminal stenosis is most severe bilaterally at C7-T1.      PHYSICAL EXAM Pleasant middle-aged [place] American male currently not in distress.Awake alert. Afebrile. Head is nontraumatic. Neck is supple without bruit. Hearing is normal. Cardiac exam no murmur or gallop. Lungs are clear to auscultation. Distal pulses are well felt. Neurological Exam ;  Awake  Alert oriented x 3. Normal speech and language.eye  movements full without nystagmus.fundi were not visualized. Vision acuity and fields appear normal. Hearing is normal. Palatal movements are normal. Face symmetric. Tongue midline. Normal strength, tone, reflexes and coordination. Normal sensation. Gait deferred. ASSESSMENT/PLAN  Fernando Bell is a 53 y.o. male with history of hypertension, hyperlipidemia and smoking who presents with right arm and leg numbness. He did not receive IV t-PA due to delay in arrival. 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 Stroke work up underway.  Stroke:  left lateral thalamic infarct,  secondary to small vessel disease     no anticoagulants prior to admission, now on aspirin 325 mg orally every day  MRI  - L lateral thalamic infarct  MRA  No large vessel stenosis/occlusion Carotid Doppler pending    2D Echo   Left ventricle: The cavity size was normal. Wall thickness was increased in a pattern of mild LVH. The estimated ejection fraction was 60%. Wall motion was normal; there were no regional wall motion abnormalities.  HgbA1c pending   Lovenox 40 mg sq daily for VTE prophylaxis  Cardiac thin liquids.   Bedrest, OOB with assistance  Resultant * mild paresthesias only  Therapy needs:  pending   Ongoing aggressive risk factor management   Risk factor education   Patient counseled to be compliant with his antithrombotic medications  Disposition:  home  Hypertension   Home meds:  HCTZ. Not resumed in hospital  BP 131-157/82-118 past 24h (01/12/2014 @ 8:35 AM)  SBP goal  Below 200   Stable   atient counseled to be compliant with his blood pressure medications  Hyperlipidemia  Home meds:  pravachol. Resumed in hospital  LDL 187   LDL goal < 100   Other Stroke Risk Factors Cigarette smoker, advised to stop smoking ETOH use  Other Active Problems  Hypothyroid, on synthroid  Complex multilevel degenerative disease  Other Pertinent History     Hospital day  # 1 I have personally examined this patient, reviewed notes, independently viewed imaging studies, participated in medical decision making and plan of care. I have made any additions or clarifications directly to the above note.   Delia Heady, MD Medical Director [facility] Pager: [phone] 01/12/2014 3:48 PM     To contact Stroke Continuity provider, please refer to [email or web address]. After hours, contact General Neurology

## 2014-01-12 NOTE — Progress Notes (Signed)
Pt developed right cheek numbness this am, no other changes in neuro exam.  Lenny Pastel, NP and Neuro hospitalist notified, no new orders received.  Will continue to monitor.

## 2014-01-12 NOTE — Progress Notes (Signed)
Note: This document was prepared with digital dictation and possible smart phrase technology. Any transcriptional errors that result from this process are unintentional.   Fernando Bell ZOX:[id number] DOB: Aug 25, 1960 DOA: 01/11/2014 PCP: Hannah Beat, MD  Brief narrative: 53 y/o ? h/o tob abuse, hld, htn, graves disease s/p ablation, diverticular disease 08/2011, prior Kidney stones admit 01/11/14 c concern for new onset stroke  Past medical history-As per Problem list Chart reviewed as below- Reviewed  Consultants:  Neurology  Procedures:  Echocardiogram  MRI  EEG    Antibiotics:  None   Subjective  Doing well. At baseline. No focal deficit Understands that a MRI showed small stroke   Objective    Interim History: Alert  Telemetry: Sinus   Objective: Filed Vitals:   01/12/14 0014 01/12/14 0159 01/12/14 0400 01/12/14 0543  BP: 131/82 138/102 132/95 137/85  Pulse: 51 76 74 55  Temp: 98.8 F (37.1 C)   98.3 F (36.8 C)  TempSrc: Oral   Oral  Resp: Height:  (1.803 m)     Weight: 80.151 kg (176 lb 11.2 oz)     SpO2: 100% 100% 100% 100%    Intake/Output Summary (Last 24 hours) at 01/12/14 0819 Last data filed at 01/12/14 0500  Gross per 24 hour  Intake 340.83 ml  Output      0 ml  Net 340.83 ml    Exam:  General: Alert pleasant oriented no apparent distress Cardiovascular: S1-S2 no murmur rub or gallop Respiratory: Clear Abdomen: Soft nontender Skin no lower extremity edema Neuro intact  Data Reviewed: Basic Metabolic Panel:  Recent Labs Lab 01/11/14 1304  NA 139  K 4.3  CL 103  CO2 23  GLUCOSE 77  BUN 12  CREATININE 0.99  CALCIUM 9.2   Liver Function Tests: No results found for this basename: AST, ALT, ALKPHOS, BILITOT, PROT, ALBUMIN,  in the last 168 hours No results found for this basename: LIPASE, AMYLASE,  in the last 168 hours No results found for this basename: AMMONIA,  in the last 168  hours CBC:  Recent Labs Lab 01/11/14 1304  WBC 4.2  NEUTROABS 1.9  HGB 14.5  HCT 43.2  MCV 82.3  PLT 192   Cardiac Enzymes: No results found for this basename: CKTOTAL, CKMB, CKMBINDEX, TROPONINI,  in the last 168 hours BNP: No components found with this basename: POCBNP,  CBG: No results found for this basename: GLUCAP,  in the last 168 hours  No results found for this or any previous visit (from the past 240 hour(s)).   Studies:              All Imaging reviewed and is as per above notation   Scheduled Meds: . aspirin  300 mg Rectal Daily   Or  . aspirin  325 mg Oral Daily  . enoxaparin (LOVENOX) injection  40 mg Subcutaneous Q24H  . levothyroxine  125 mcg Oral QAC breakfast  . pravastatin  20 mg Oral Daily   Continuous Infusions: . sodium chloride 50 mL/hr at 01/12/14 0035     Assessment/Plan: 1. Stroke-Follow workup. Complete with carotid Dopplers. Continue aspirin 325 mg daily-respiratory urology 2. Hypothyroid secondary to Graves' disease and ablation-continue levothyroxine 100 and daily 3. Hyperlipidemia-total cholesterol 217 LDL 1 history, start statins this admission Pravachol 20 mg  Code Status: Full  Family Communication: Discussed with wife at bedside  Disposition Plan: Likely discharge one to 2 days   Pleas Koch, MD  [facility] Pager [phone] 01/12/2014, 8:19 AM    LOS: 1 day

## 2014-01-12 NOTE — Progress Notes (Signed)
Echocardiogram 2D Echocardiogram has been performed.  Dorothey Baseman 01/12/2014, 11:08 AM

## 2014-01-12 NOTE — Care Management Note (Unsigned)
    Page 1 of 1   01/12/2014     11:52:53 AM CARE MANAGEMENT NOTE 01/12/2014  Patient:  Fernando Bell, Fernando Bell   Account Number:  [id number]  Date Initiated:  01/12/2014  Documentation initiated by:  GRAVES-BIGELOW,Leary Mcnulty  Subjective/Objective Assessment:   Pt admitted for Right arm and leg numbness. MRI + for Acute 3 mm infarction affecting the lateral thalamus on the left. Pt is from home with wife.     Action/Plan:   CM will continue to monitor for disposition needs.   Anticipated DC Date:  01/14/2014   Anticipated DC Plan:  HOME/SELF CARE      DC Planning Services  CM consult      Choice offered to / List presented to:             Status of service:  In process, will continue to follow Medicare Important Message given?  NO (If response is "NO", the following Medicare IM given date fields will be blank) Date Medicare IM given:   Medicare IM given by:   Date Additional Medicare IM given:   Additional Medicare IM given by:    Discharge Disposition:    Per UR Regulation:  Reviewed for med. necessity/level of care/duration of stay  If discussed at Long Length of Stay Meetings, dates discussed:    Comments:

## 2014-01-12 NOTE — Progress Notes (Signed)
[facility] progress note. Chief complaint. Transfer note. History of present illness. This 53 year old male presented to [facility] long emergency room with numbness of the right upper and lower remedy his starting approximately 4 days ago. MRI of the brain indicated a thalamic stroke. On-call neurologist notified and requested transfer to [facility] for further stroke workup. Patient has now arrived and I am seeing him at bedside to ensure he remains clinically stable and that his orders transferred appropriately. The patient alert and without complaint. Physical exam. Vital signs. Temperature 98.8, pulse 51, respiration 18, blood pressure 131/82. O2 sats 100%. General appearance. Well-developed middle-aged male who is alert and oriented. Cardiac. Rate and rhythm regular. Lungs. Breath sounds clear and equal. Abdomen. Soft with positive bowel sounds. Neurologic. Cranial nerves 2-12 grossly intact. Strength 5/5 and equal in all 4 extremities. Impression/plan Problem #1 CVA. Patient for a brain MRI, 2-D echocardiogram, carotid Dopplers. Aspirin prophylaxis. I did notify neurology the patient's arrival. Problem #2. Hyperlipidemia. Patient continues on status. Problem #3. Hypertension. Is when necessary hydralazine. Problem #4. Tobacco abuse. Asked the patient would like to consider a nicotine patch. He declines at this time. Patient appears clinically stable post transfer. All orders appear to of transferred appropriately.

## 2014-01-13 MED ORDER — ASPIRIN 325 MG PO TABS: 325.0000 mg | ORAL_TABLET | Freq: Every day | ORAL | Status: DC

## 2014-01-13 MED ORDER — PRAVASTATIN SODIUM 80 MG PO TABS: 80.0000 mg | ORAL_TABLET | Freq: Every day | ORAL | Status: DC

## 2014-01-13 NOTE — Progress Notes (Signed)
*  PRELIMINARY RESULTS* Vascular Ultrasound Carotid Duplex (Doppler) has been completed.  Preliminary findings: Bilateral:  1-39% ICA stenosis.  Vertebral artery flow is antegrade.      Farrel Demark, RDMS, RVT  01/13/2014, 8:37 AM

## 2014-01-13 NOTE — Progress Notes (Signed)
Patient discharged home. Instructions given and reviewed with patient and wife.

## 2014-01-13 NOTE — Progress Notes (Signed)
Occupational Therapy Evaluation Patient Details Name: Fernando Bell MRN: [id number] DOB: 1960-05-09 Today's Date: 01/13/2014    History of Present Illness Fernando Bell is a 53 y.o. male with history of hypertension, hyperlipidemia, ongoing tobacco abuse, Graves' disease status post ablation presents to the ER because of persistent numbness of the right upper and lower extremities. Patient's symptoms started on Wednesday 4 days ago with numbness of the right lower extremity and eventually spreading to his right side of his trunk and upper extremity. Denies any associated weakness of the upper lower extremities or any visual symptoms nausea vomiting dizziness loss of consciousness difficulty speaking or swallowing. Since the symptoms were persistent he came to the ER an MRI of the brain done shows left thalamic stroke   Clinical Impression   Pt independent with ADL and mobility. Pt with partial sensory loss RU/LE and R flank. Educated on warning signs/symptoms of CVA and smoking cessation/written information given. Pt very appreciative. OT signing off.     Follow Up Recommendations  No OT follow up;Supervision - Intermittent    Equipment Recommendations  None recommended by OT    Recommendations for Other Services       Precautions / Restrictions Precautions Precautions: None      Mobility Bed Mobility Overal bed mobility: Independent                Transfers Overall transfer level: Independent                    Balance Overall balance assessment: No apparent balance deficits (not formally assessed)                                          ADL Overall ADL's : Independent                                             Vision                     Perception     Praxis Praxis Praxis tested?: Within functional limits    Pertinent Vitals/Pain       Hand Dominance Right   Extremity/Trunk Assessment Upper  Extremity Assessment Upper Extremity Assessment: RUE deficits/detail RUE Sensation: decreased light touch (forearm; )   Lower Extremity Assessment Lower Extremity Assessment: RLE deficits/detail RLE Sensation: decreased light touch (toes)   Cervical / Trunk Assessment Cervical / Trunk Assessment: Normal;Other exceptions (decreased sensation R flank)   Communication Communication Communication: No difficulties   Cognition Arousal/Alertness: Awake/alert Behavior During Therapy: WFL for tasks assessed/performed Overall Cognitive Status: Within Functional Limits for tasks assessed                     General Comments       Exercises       Shoulder Instructions      Home Living Family/patient expects to be discharged to:: Private residence Living Arrangements: Spouse/significant other Available Help at Discharge: Family;Available 24 hours/day Type of Home: House Home Access: Level entry     Home Layout: Two level;Bed/bath upstairs Alternate Level Stairs-Number of Steps: 11 Alternate Level Stairs-Rails: Right Bathroom Shower/Tub: Chief Strategy Officer: Standard     Home Equipment: None  Prior Functioning/Environment Level of Independence: Independent             OT Diagnosis:     OT Problem List:     OT Treatment/Interventions:      OT Goals(Current goals can be found in the care plan section) Acute Rehab OT Goals Patient Stated Goal: to not have another stroke  OT Frequency:     Barriers to D/C:            Co-evaluation              End of Session Nurse Communication: Mobility status  Activity Tolerance: Patient tolerated treatment well Patient left: in bed;with call bell/phone within reach;with family/visitor present   Time: 1191-4782 OT Time Calculation (min): 30 min Charges:  OT General Charges $OT Visit: 1 Procedure OT Evaluation $Initial OT Evaluation Tier I: 1 Procedure OT Treatments $Self  Care/Home Management : 8-22 mins G-Codes:    Dazaria Macneill,HILLARY 2014/01/15, 10:04 AM   Luisa Dago, OTR/L  [phone] 2014/01/15

## 2014-01-13 NOTE — Discharge Summary (Signed)
Physician Discharge Summary  Fernando Bell:[id number] DOB: [birth year] DOA: 01/11/2014  PCP: Hannah Beat, MD  Admit date: 01/11/2014 Discharge date: 01/13/2014  Time spent: 35 minutes  Recommendations for Outpatient Follow-up:  1. Increased pravachol-->80 mg daily 2. Start ASA 325 3. Needs rpt A1c and lipid panel 3 mo, rpt tsh 6 wk 4. Counseled diet/exercise and follow up as OP cnutritionist  5. No therapy needs  Discharge Diagnoses:  Principal Problem:   CVA (cerebral infarction) Active Problems:   HYPERLIPIDEMIA   HYPERTENSION   Discharge Condition: stable  Diet recommendation: reg heart healthy  Filed Weights   01/12/14 0014  Weight: 80.151 kg (176 lb 11.2 oz)    History of present illness:  53 y/o ? h/o tob abuse, hld, htn, graves disease s/p ablation, diverticular disease 08/2011, prior Kidney stones admit 01/11/14 c concern for new onset stroke He had a work up showing acute CVA L sided Rest of work-up echo/carotids, MRA  Was neg Neurology saw patient recommending ASa 325. Pravachol ? 20-->80 Follow OP neurology Long discussion bedside of preventable/modifiable risk factors for 10 min    Discharge Exam: Filed Vitals:   01/13/14 0405  BP: 129/92  Pulse: 55  Temp: 98.8 F (37.1 C)  Resp: 16    General: alert no deficit Cardiovascular: s1 s 2no m/r/g Respiratory: clear  Discharge Instructions You were cared for by a hospitalist during your hospital stay. If you have any questions about your discharge medications or the care you received while you were in the hospital after you are discharged, you can call the unit and asked to speak with the hospitalist on call if the hospitalist that took care of you is not available. Once you are discharged, your primary care physician will handle any further medical issues. Please note that NO REFILLS for any discharge medications will be authorized once you are discharged, as it is imperative that you return to your  primary care physician (or establish a relationship with a primary care physician if you do not have one) for your aftercare needs so that they can reassess your need for medications and monitor your lab values.   Current Discharge Medication List    START taking these medications   Details  aspirin 325 MG tablet Take 1 tablet (325 mg total) by mouth daily.      CONTINUE these medications which have CHANGED   Details  pravastatin (PRAVACHOL) 80 MG tablet Take 1 tablet (80 mg total) by mouth daily. Qty: 30 tablet, Refills: 0      CONTINUE these medications which have NOT CHANGED   Details  hydrochlorothiazide (HYDRODIURIL) 25 MG tablet Take 25 mg by mouth daily.    levothyroxine (SYNTHROID, LEVOTHROID) 125 MCG tablet Take 125 mcg by mouth daily before breakfast.       Allergies  Allergen Reactions  . Iodine Shortness Of Breath   Follow-up Information   Follow up with [facility]. Call on 01/12/2014. (fo close outpatient follow up.)    Contact information:   [address] [place] [address] [phone]       The results of significant diagnostics from this hospitalization (including imaging, microbiology, ancillary and laboratory) are listed below for reference.    Significant Diagnostic Studies: Dg Chest 2 View  01/12/2014   CLINICAL DATA:  Stroke, no chest complaint  EXAM: CHEST  2 VIEW  COMPARISON:  Chest radiograph 05/19/2013  FINDINGS: Normal mediastinum and cardiac silhouette. Normal pulmonary vasculature. No evidence of effusion,  infiltrate, or pneumothorax. No acute bony abnormality.  IMPRESSION: No acute cardiopulmonary process.   Electronically Signed   By: Genevive Bi M.D.   On: 01/12/2014 13:38   Dg Cervical Spine Complete  01/11/2014   CLINICAL DATA:  Right upper extremity numbness and tingling which began approximately 3 days ago. No recent injuries.  EXAM: CERVICAL SPINE  4+ VIEWS  COMPARISON:  None.  FINDINGS: Reversal of the usual  cervical lordosis centered at C5-6. Anatomic posterior alignment. No visible fractures. Disc space narrowing and endplate hypertrophic changes at every level from C4-5 through T1 to, greatest at C7-T1. Facet joints intact. Moderate right foraminal stenoses at C5-6, C6-7 and C7-T1 and moderate left foraminal stenosis on the left at C7-T1. No static evidence of instability. Bilateral carotid atherosclerosis noted.  IMPRESSION: Reversal of the usual lordosis centered at C5-6, likely reflecting positioning and/or spasm. Multilevel degenerative disc disease and spondylosis, greatest at C7-T1. Right foraminal stenoses at C5-6, C6-7 and C7-T1 and left foraminal stenosis at C7-T1.   Electronically Signed   By: Hulan Saas M.D.   On: 01/11/2014 13:36   Ct Head Wo Contrast  01/11/2014   CLINICAL DATA:  Four days of right-sided tingling and numbness  EXAM: CT HEAD WITHOUT CONTRAST  TECHNIQUE: Contiguous axial images were obtained from the base of the skull through the vertex without intravenous contrast.  COMPARISON:  None.  FINDINGS: The ventricles are normal in size and position. There is no intracranial hemorrhage nor intracranial mass effect. There is no acute ischemic change. The cerebellum and brainstem are unremarkable.  The observed paranasal sinuses and mastoid air cells are clear. There is no acute skull fracture.  IMPRESSION: There is no acute intracranial hemorrhage nor acute ischemic change nor other acute intracranial abnormality.   Electronically Signed   By: David  [place]   On: 01/11/2014 13:23   Mr Brain Wo Contrast  01/11/2014   CLINICAL DATA:  Numbness of the right side of the body. Pain in the right arm and hand.  EXAM: MRI HEAD WITHOUT CONTRAST  MRI CERVICAL SPINE WITHOUT CONTRAST  TECHNIQUE: Multiplanar, multiecho pulse sequences of the brain and surrounding structures, and cervical spine, to include the craniocervical junction and cervicothoracic junction, were obtained without intravenous  contrast.  COMPARISON:  Head CT same day.  FINDINGS: MRI HEAD FINDINGS  Diffusion imaging shows a 3 mm acute infarction in the lateral thalamus on the left. No other acute infarction.  The brainstem and cerebellum are normal. The cerebral hemispheres otherwise show moderate chronic small-vessel ischemic changes within the deep and subcortical white matter. No cortical or large vessel territory infarction. No mass lesion, hemorrhage, hydrocephalus or extra-axial collection. No pituitary mass. No inflammatory sinus disease. No skull or skullbase lesion.  MRI CERVICAL SPINE FINDINGS  The foramen magnum is widely patent. There is ordinary osteoarthritis of the C1-2 articulation but no encroachment upon the neural spaces.  C2-3:  Normal interspace.  C3-4: There is facet arthropathy right worse than left with anterolisthesis of 2 mm. There is facet joint edema on the right, likely to be associated with pain. No compressive stenosis of the central canal. Foraminal narrowing right more than left could possibly affect the C4 nerve roots.  C4-5: Disc bulge. No cord compression. Mild foraminal stenosis bilaterally.  C5-6: Bilateral uncovertebral prominence with bilateral foraminal narrowing. No central canal stenosis.  C6-7: Endplate osteophytes. Narrowing of the ventral subarachnoid space but no compression of the cord. Bilateral foraminal narrowing.  C7-T1: Spondylosis with endplate osteophytes  and protruding disc material. Effacement of the subarachnoid space but no compression of the cord. Foraminal stenosis bilaterally that could compress either or both C8 nerve roots.  T1-2: Disc bulge. No cord compression. Foraminal stenosis on the left that could affect the T1 nerve root.  No abnormal cord signal.  IMPRESSION: MRI brain: Acute 3 mm infarction affecting the lateral thalamus on the left. Moderate chronic small-vessel changes elsewhere as outlined above.  MRI cervical spine: Complex multilevel degenerative disease. No  cord compression. Canal stenosis is most pronounced at C7-T1 with the subarachnoid space is effaced but the cord is not deformed and there is no cord edema.  Facet arthropathy on the right at C3-4 that could be focally painful.  Foraminal stenosis bilaterally in the region from C3-4 through C7-T1. The exiting nerve roots could be compressed or irritated as they exit the narrowed foramina. Foraminal stenosis is most severe bilaterally at C7-T1.   Electronically Signed   By: Paulina Fusi M.D.   On: 01/11/2014 20:24   Mr Cervical Spine Wo Contrast  01/11/2014   CLINICAL DATA:  Numbness of the right side of the body. Pain in the right arm and hand.  EXAM: MRI HEAD WITHOUT CONTRAST  MRI CERVICAL SPINE WITHOUT CONTRAST  TECHNIQUE: Multiplanar, multiecho pulse sequences of the brain and surrounding structures, and cervical spine, to include the craniocervical junction and cervicothoracic junction, were obtained without intravenous contrast.  COMPARISON:  Head CT same day.  FINDINGS: MRI HEAD FINDINGS  Diffusion imaging shows a 3 mm acute infarction in the lateral thalamus on the left. No other acute infarction.  The brainstem and cerebellum are normal. The cerebral hemispheres otherwise show moderate chronic small-vessel ischemic changes within the deep and subcortical white matter. No cortical or large vessel territory infarction. No mass lesion, hemorrhage, hydrocephalus or extra-axial collection. No pituitary mass. No inflammatory sinus disease. No skull or skullbase lesion.  MRI CERVICAL SPINE FINDINGS  The foramen magnum is widely patent. There is ordinary osteoarthritis of the C1-2 articulation but no encroachment upon the neural spaces.  C2-3:  Normal interspace.  C3-4: There is facet arthropathy right worse than left with anterolisthesis of 2 mm. There is facet joint edema on the right, likely to be associated with pain. No compressive stenosis of the central canal. Foraminal narrowing right more than left could  possibly affect the C4 nerve roots.  C4-5: Disc bulge. No cord compression. Mild foraminal stenosis bilaterally.  C5-6: Bilateral uncovertebral prominence with bilateral foraminal narrowing. No central canal stenosis.  C6-7: Endplate osteophytes. Narrowing of the ventral subarachnoid space but no compression of the cord. Bilateral foraminal narrowing.  C7-T1: Spondylosis with endplate osteophytes and protruding disc material. Effacement of the subarachnoid space but no compression of the cord. Foraminal stenosis bilaterally that could compress either or both C8 nerve roots.  T1-2: Disc bulge. No cord compression. Foraminal stenosis on the left that could affect the T1 nerve root.  No abnormal cord signal.  IMPRESSION: MRI brain: Acute 3 mm infarction affecting the lateral thalamus on the left. Moderate chronic small-vessel changes elsewhere as outlined above.  MRI cervical spine: Complex multilevel degenerative disease. No cord compression. Canal stenosis is most pronounced at C7-T1 with the subarachnoid space is effaced but the cord is not deformed and there is no cord edema.  Facet arthropathy on the right at C3-4 that could be focally painful.  Foraminal stenosis bilaterally in the region from C3-4 through C7-T1. The exiting nerve roots could be compressed or irritated  as they exit the narrowed foramina. Foraminal stenosis is most severe bilaterally at C7-T1.   Electronically Signed   By: Paulina Fusi M.D.   On: 01/11/2014 20:24   Mr Maxine Glenn Head/brain Wo Cm  01/12/2014   CLINICAL DATA:  53 year old male with right side weakness, discovered to have acute left thalamic lacunar infarct on MRI 12/2013. Initial encounter.  EXAM: MRA HEAD WITHOUT CONTRAST  TECHNIQUE: Angiographic images of the Circle of Willis were obtained using MRA technique without intravenous contrast.  COMPARISON:  Brain MRI 12/2013.  FINDINGS: Antegrade flow in the posterior circulation with mildly dominant distal left vertebral artery. Normal  vertebrobasilar junction. Dominant appearing patent AICA origins. No basilar artery stenosis. Normal SCA and PCA origins. Posterior communicating arteries are diminutive or absent. Bilateral PCA branches are within normal limits; minimal if any irregularity in the left P1 segment.  Antegrade flow in both ICA siphons. No siphon stenosis. Normal ophthalmic artery origins. Patent carotid termini. Dominant right ACA A1 segment. Normal MCA and ACA origins. Normal anterior communicating artery and visualized bilateral ACA branches. Visualized bilateral MCA branches are within normal limits.  IMPRESSION: Negative intracranial MRA.   Electronically Signed   By: Augusto Gamble M.D.   On: 01/12/2014 09:16    Microbiology: No results found for this or any previous visit (from the past 240 hour(s)).   Labs: Basic Metabolic Panel:  Recent Labs Lab 01/11/14 1304 01/12/14 0915  NA 139 138  K 4.3 4.0  CL 103 99  CO2 23 26  GLUCOSE 77 130*  BUN 12 14  CREATININE 0.99 1.02  CALCIUM 9.2 9.3   Liver Function Tests:  Recent Labs Lab 01/12/14 0915  AST 22  ALT 24  ALKPHOS 64  BILITOT 0.3  PROT 7.0  ALBUMIN 3.7   No results found for this basename: LIPASE, AMYLASE,  in the last 168 hours No results found for this basename: AMMONIA,  in the last 168 hours CBC:  Recent Labs Lab 01/11/14 1304 01/12/14 0915  WBC 4.2 4.0  NEUTROABS 1.9 1.8  HGB 14.5 14.8  HCT 43.2 44.4  MCV 82.3 83.1  PLT 192 188   Cardiac Enzymes: No results found for this basename: CKTOTAL, CKMB, CKMBINDEX, TROPONINI,  in the last 168 hours BNP: BNP (last 3 results) No results found for this basename: PROBNP,  in the last 8760 hours CBG: No results found for this basename: GLUCAP,  in the last 168 hours     Signed:  Rhetta Mura  [facility] 01/13/2014, 11:41 AM

## 2014-01-13 NOTE — Progress Notes (Signed)
STROKE TEAM PROGRESS NOTE   HISTORY Fernando Bell is a 53 y.o. male with a history of hypertension, hyperlipidemia, smoking who presents with right-sided numbness that started several days ago. He states that he was walking into his house and felt his right leg and arm go numb all of a sudden. He has a little bit of facial involvement, but not much. Since that time, it has been persistent, and he noticed some tingling today and therefore came to the emergency room for further evaluation. In the emergency room, an MRI was obtained which shows a small acute infarction in the left thalamus.  LKW: 9/16 Patient was not administered TPA secondary to delay in arrival. He was admitted for further evaluation and treatment.   SUBJECTIVE (INTERVAL HISTORY) His  wife is at the bedside.  Overall he feels his condition is gradually improving. He still has some residual numbness. He is ready to go home   OBJECTIVE Temp:  [98.5 F (36.9 C)-98.9 F (37.2 C)] 98.9 F (37.2 C) (09/22 1359) Pulse Rate:  [55-68] 63 (09/22 1359) Cardiac Rhythm:  [-] Normal sinus rhythm (09/22 0800) Resp:  [16-18] 18 (09/22 1359) BP: (129-141)/(81-92) 129/81 mmHg (09/22 1359) SpO2:  [100 %] 100 % (09/22 1359)   Recent Labs Lab 01/11/14 1304 01/12/14 0915  NA 139 138  K 4.3 4.0  CL 103 99  CO2 23 26  GLUCOSE 77 130*  BUN 12 14  CREATININE 0.99 1.02  CALCIUM 9.2 9.3    Recent Labs Lab 01/12/14 0915  AST 22  ALT 24  ALKPHOS 64  BILITOT 0.3  PROT 7.0  ALBUMIN 3.7    Recent Labs Lab 01/11/14 1304 01/12/14 0915  WBC 4.2 4.0  NEUTROABS 1.9 1.8  HGB 14.5 14.8  HCT 43.2 44.4  MCV 82.3 83.1  PLT 192 188   No results found for this basename: CKTOTAL, CKMB, CKMBINDEX, TROPONINI,  in the last 168 hours No results found for this basename: LABPROT, INR,  in the last 72 hours No results found for this basename: COLORURINE, APPERANCEUR, LABSPEC, PHURINE, GLUCOSEU, HGBUR, BILIRUBINUR, KETONESUR, PROTEINUR,  UROBILINOGEN, NITRITE, LEUKOCYTESUR,  in the last 72 hours     Component Value Date/Time   CHOL 217* 01/12/2014 0915   TRIG 88 01/12/2014 0915   HDL 76 01/12/2014 0915   CHOLHDL 2.9 01/12/2014 0915   VLDL 18 01/12/2014 0915   LDLCALC 123* 01/12/2014 0915   Lab Results  Component Value Date   HGBA1C 5.7* 01/12/2014   No results found for this basename: labopia,  cocainscrnur,  labbenz,  amphetmu,  thcu,  labbarb    No results found for this basename: ETH,  in the last 168 hours  Dg Cervical Spine Complete 01/11/2014   Reversal of the usual lordosis centered at C5-6, likely reflecting positioning and/or spasm. Multilevel degenerative disc disease and spondylosis, greatest at C7-T1. Right foraminal stenoses at C5-6, C6-7 and C7-T1 and left foraminal stenosis at C7-T1.     Ct Head Wo Contrast 01/11/2014   There is no acute intracranial hemorrhage nor acute ischemic change nor other acute intracranial abnormality.     Mr Brain Wo Contrast 01/11/2014    Acute 3 mm infarction affecting the lateral thalamus on the left. Moderate chronic small-vessel changes elsewhere as outlined above.    Mr Cervical Spine Wo Contrast 01/11/2014   Complex multilevel degenerative disease. No cord compression. Canal stenosis is most pronounced at C7-T1 with the subarachnoid space is effaced but the cord is not deformed  and there is no cord edema.  Facet arthropathy on the right at C3-4 that could be focally painful.  Foraminal stenosis bilaterally in the region from C3-4 through C7-T1. The exiting nerve roots could be compressed or irritated as they exit the narrowed foramina. Foraminal stenosis is most severe bilaterally at C7-T1.      PHYSICAL EXAM Pleasant middle-aged [place] American male currently not in distress.Awake alert. Afebrile. Head is nontraumatic. Neck is supple without bruit. Hearing is normal. Cardiac exam no murmur or gallop. Lungs are clear to auscultation. Distal pulses are well felt. Neurological  Exam ;  Awake  Alert oriented x 3. Normal speech and language.eye movements full without nystagmus.fundi were not visualized. Vision acuity and fields appear normal. Hearing is normal. Palatal movements are normal. Face symmetric. Tongue midline. Normal strength, tone, reflexes and coordination. Normal sensation. Gait deferred. ASSESSMENT/PLAN  Fernando Bell is a 53 y.o. male with history of hypertension, hyperlipidemia and smoking who presents with right arm and leg numbness. He did not receive IV t-PA due to delay in arrival. 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 Stroke work up underway.  Stroke:  left lateral thalamic infarct,  secondary to small vessel disease     no anticoagulants prior to admission, now on aspirin 325 mg orally every day  MRI  - L lateral thalamic infarct  MRA  No large vessel stenosis/occlusion Carotid Doppler Bilateral: 1-39% ICA stenosis. Vertebral artery flow is antegrade.      2D Echo   Left ventricle: The cavity size was normal. Wall thickness was increased in a pattern of mild LVH. The estimated ejection fraction was 60%. Wall motion was normal; there were no regional wall motion abnormalities.  HgbA1c 5.7   Lovenox 40 mg sq daily for VTE prophylaxis  Cardiac thin liquids.   Bedrest, OOB with assistance  Resultant * mild paresthesias only  Therapy needs:  home no needs  Ongoing aggressive risk factor management   Risk factor education   Patient counseled to be compliant with his antithrombotic medications  Disposition:  home  Hypertension   Home meds:  HCTZ. Not resumed in hospital  BP 131-157/82-118 past 24h (01/13/2014 @ 2:10 PM)  SBP goal  Below 200   Stable   atient counseled to be compliant with his blood pressure medications  Hyperlipidemia  Home meds:  pravachol. Resumed in hospital  LDL 187   LDL goal < 100   Other Stroke Risk Factors Cigarette smoker, advised to stop smoking ETOH use  Other  Active Problems  Hypothyroid, on synthroid  Complex multilevel degenerative disease  Other Pertinent History     Hospital day # 2 Stroke team will sign off. Kindly call for questions. Follow up with Dr. Roda Shutters in 4 weeks at stroke clinic as outpatient  Delia Heady, MD Medical Director [facility] Pager: [phone] 01/13/2014 2:10 PM     To contact Stroke Continuity provider, please refer to [email or web address]. After hours, contact General Neurology

## 2014-01-14 ENCOUNTER — Encounter: Payer: Self-pay | Admitting: Family Medicine

## 2014-01-19 ENCOUNTER — Encounter: Payer: Self-pay | Admitting: Family Medicine

## 2014-01-19 ENCOUNTER — Ambulatory Visit (INDEPENDENT_AMBULATORY_CARE_PROVIDER_SITE_OTHER): Payer: BC Managed Care – PPO | Admitting: Family Medicine

## 2014-01-19 VITALS — BP 130/92 | HR 78 | Temp 99.0°F | Ht 70.5 in | Wt 177.2 lb

## 2014-01-19 DIAGNOSIS — R739 Hyperglycemia, unspecified: Secondary | ICD-10-CM

## 2014-01-19 DIAGNOSIS — I6359 Cerebral infarction due to unspecified occlusion or stenosis of other cerebral artery: Secondary | ICD-10-CM

## 2014-01-19 DIAGNOSIS — R7309 Other abnormal glucose: Secondary | ICD-10-CM

## 2014-01-19 DIAGNOSIS — E785 Hyperlipidemia, unspecified: Secondary | ICD-10-CM

## 2014-01-19 DIAGNOSIS — F17200 Nicotine dependence, unspecified, uncomplicated: Secondary | ICD-10-CM

## 2014-01-19 DIAGNOSIS — E890 Postprocedural hypothyroidism: Secondary | ICD-10-CM

## 2014-01-19 DIAGNOSIS — I6350 Cerebral infarction due to unspecified occlusion or stenosis of unspecified cerebral artery: Secondary | ICD-10-CM

## 2014-01-19 DIAGNOSIS — I10 Essential (primary) hypertension: Secondary | ICD-10-CM

## 2014-01-19 DIAGNOSIS — R5383 Other fatigue: Secondary | ICD-10-CM

## 2014-01-19 DIAGNOSIS — R5381 Other malaise: Secondary | ICD-10-CM

## 2014-01-19 NOTE — Patient Instructions (Signed)

## 2014-01-19 NOTE — Progress Notes (Signed)
Dr. [name] T. Taseen Marasigan, MD, CAQ [facility] [address] [place], [address] Phone: [phone] Fax: [phone]  01/19/2014  Patient: Fernando Bell, MRN: [id number], DOB: 06-27-1960, 54 y.o.  Primary Physician:  Hannah Beat, MD  Chief Complaint: Hospitalization Follow-up  Subjective:   Fernando Bell is a 53 y.o. very pleasant male patient who presents with the following:  F/u Hosp d/c L CVA.  Admit date: 01/11/2014  Discharge date: 01/13/2014   Recommendations for Outpatient Follow-up:  1. Increased pravachol-->80 mg daily 2. Start ASA 325 3. Needs rpt A1c and lipid panel 3 mo, rpt tsh 6 wk 4. Counseled diet/exercise and follow up as OP cnutritionist  5. No therapy needs  Discharge Diagnoses:  Principal Problem:  CVA (cerebral infarction)  Active Problems:  HYPERLIPIDEMIA  HYPERTENSION  Patient who is a heavy smoker with a history of HTN and hyperlipidemia who presents with hospital follow-up for L CVA, admitted 01/11/2014. Still numb R arm and chest.  Initially let it go and went to sleep. Drove 2600 miles  And went to the ER on Sunday when he got back home.   Right now, he actually feels quite well other than having some numbness in his RIGHT arm as well as some numbness in his chest. The numbness he was having in his feet is basically improved at this point. He is not having a baseline weakness.No changes in his facial musculature. No alteration in balance. His thought processes are clear.  Smoking.  Down to 12 cigarettes a day.  Weaning off cold [place].   BP has been fine. He has been checking this at home, and it has been normal.It is mildly elevated today.  Lipids: Doing well, increased to pravastatin 80 in hospital. Tolerating meds fine with no SE. Panel reviewed with patient.  Lipids:    Component Value Date/Time   CHOL 217* 01/12/2014 0915   TRIG 88 01/12/2014 0915   HDL 76 01/12/2014 0915   LDLDIRECT 170.1  08/14/2011 1103   VLDL 18 01/12/2014 0915   CHOLHDL 2.9 01/12/2014 0915    Lab Results  Component Value Date   ALT 24 01/12/2014   AST 22 01/12/2014   ALKPHOS 64 01/12/2014   BILITOT 0.3 01/12/2014     F/u a1c, lipid, liver, 3 mo  Thyroid: No symptoms. Labs reviewed. Denies cold / heat intolerance, dry skin, hair loss. No goiter.  Lab Results  Component Value Date   TSH 1.260 01/12/2014    tsh 6 wk.   Past Medical History, Surgical History, Social History, Family History, Problem List, Medications, and Allergies have been reviewed and updated if relevant.   GEN: No acute illnesses, no fevers, chills. GI: No n/v/d, eating normally Pulm: No SOB Interactive and getting along well at home.  Otherwise, ROS is as per the HPI.  Objective:   BP 130/92  Pulse 78  Temp(Src) 99 F (37.2 C) (Oral)  Ht 5' 10.5" (1.791 m)  Wt 177 lb 4 oz (80.4 kg)  BMI 25.06 kg/m2  GEN: WDWN, NAD, Non-toxic, A & O x 3 HEENT: Atraumatic, Normocephalic. Neck supple. No masses, No LAD. Ears and Nose: No external deformity. CV: RRR, No M/G/R. No JVD. No thrill. No extra heart sounds. PULM: CTA B, no wheezes, crackles, rhonchi. No retractions. No resp. distress. No accessory muscle use. EXTR: No c/c/e NEURO Normal gait.  PSYCH: Normally interactive. Conversant. Not depressed or anxious appearing.  Calm demeanor.   Laboratory and Imaging  Data:  Mr Sherrin Daisy Contrast  01/11/2014   CLINICAL DATA:  Numbness of the right side of the body. Pain in the right arm and hand.  EXAM: MRI HEAD WITHOUT CONTRAST  MRI CERVICAL SPINE WITHOUT CONTRAST  TECHNIQUE: Multiplanar, multiecho pulse sequences of the brain and surrounding structures, and cervical spine, to include the craniocervical junction and cervicothoracic junction, were obtained without intravenous contrast.  COMPARISON:  Head CT same day.  FINDINGS: MRI HEAD FINDINGS  Diffusion imaging shows a 3 mm acute infarction in the lateral thalamus on the left. No  other acute infarction.  The brainstem and cerebellum are normal. The cerebral hemispheres otherwise show moderate chronic small-vessel ischemic changes within the deep and subcortical white matter. No cortical or large vessel territory infarction. No mass lesion, hemorrhage, hydrocephalus or extra-axial collection. No pituitary mass. No inflammatory sinus disease. No skull or skullbase lesion.  MRI CERVICAL SPINE FINDINGS  The foramen magnum is widely patent. There is ordinary osteoarthritis of the C1-2 articulation but no encroachment upon the neural spaces.  C2-3:  Normal interspace.  C3-4: There is facet arthropathy right worse than left with anterolisthesis of 2 mm. There is facet joint edema on the right, likely to be associated with pain. No compressive stenosis of the central canal. Foraminal narrowing right more than left could possibly affect the C4 nerve roots.  C4-5: Disc bulge. No cord compression. Mild foraminal stenosis bilaterally.  C5-6: Bilateral uncovertebral prominence with bilateral foraminal narrowing. No central canal stenosis.  C6-7: Endplate osteophytes. Narrowing of the ventral subarachnoid space but no compression of the cord. Bilateral foraminal narrowing.  C7-T1: Spondylosis with endplate osteophytes and protruding disc material. Effacement of the subarachnoid space but no compression of the cord. Foraminal stenosis bilaterally that could compress either or both C8 nerve roots.  T1-2: Disc bulge. No cord compression. Foraminal stenosis on the left that could affect the T1 nerve root.  No abnormal cord signal.  IMPRESSION: MRI brain: Acute 3 mm infarction affecting the lateral thalamus on the left. Moderate chronic small-vessel changes elsewhere as outlined above.  MRI cervical spine: Complex multilevel degenerative disease. No cord compression. Canal stenosis is most pronounced at C7-T1 with the subarachnoid space is effaced but the cord is not deformed and there is no cord edema.  Facet  arthropathy on the right at C3-4 that could be focally painful.  Foraminal stenosis bilaterally in the region from C3-4 through C7-T1. The exiting nerve roots could be compressed or irritated as they exit the narrowed foramina. Foraminal stenosis is most severe bilaterally at C7-T1.   Electronically Signed   By: Paulina Fusi M.D.   On: 01/11/2014 20:24   Mr Cervical Spine Wo Contrast  01/11/2014   CLINICAL DATA:  Numbness of the right side of the body. Pain in the right arm and hand.  EXAM: MRI HEAD WITHOUT CONTRAST  MRI CERVICAL SPINE WITHOUT CONTRAST  TECHNIQUE: Multiplanar, multiecho pulse sequences of the brain and surrounding structures, and cervical spine, to include the craniocervical junction and cervicothoracic junction, were obtained without intravenous contrast.  COMPARISON:  Head CT same day.  FINDINGS: MRI HEAD FINDINGS  Diffusion imaging shows a 3 mm acute infarction in the lateral thalamus on the left. No other acute infarction.  The brainstem and cerebellum are normal. The cerebral hemispheres otherwise show moderate chronic small-vessel ischemic changes within the deep and subcortical white matter. No cortical or large vessel territory infarction. No mass lesion, hemorrhage, hydrocephalus or extra-axial collection. No pituitary mass. No  inflammatory sinus disease. No skull or skullbase lesion.  MRI CERVICAL SPINE FINDINGS  The foramen magnum is widely patent. There is ordinary osteoarthritis of the C1-2 articulation but no encroachment upon the neural spaces.  C2-3:  Normal interspace.  C3-4: There is facet arthropathy right worse than left with anterolisthesis of 2 mm. There is facet joint edema on the right, likely to be associated with pain. No compressive stenosis of the central canal. Foraminal narrowing right more than left could possibly affect the C4 nerve roots.  C4-5: Disc bulge. No cord compression. Mild foraminal stenosis bilaterally.  C5-6: Bilateral uncovertebral prominence with  bilateral foraminal narrowing. No central canal stenosis.  C6-7: Endplate osteophytes. Narrowing of the ventral subarachnoid space but no compression of the cord. Bilateral foraminal narrowing.  C7-T1: Spondylosis with endplate osteophytes and protruding disc material. Effacement of the subarachnoid space but no compression of the cord. Foraminal stenosis bilaterally that could compress either or both C8 nerve roots.  T1-2: Disc bulge. No cord compression. Foraminal stenosis on the left that could affect the T1 nerve root.  No abnormal cord signal.  IMPRESSION: MRI brain: Acute 3 mm infarction affecting the lateral thalamus on the left. Moderate chronic small-vessel changes elsewhere as outlined above.  MRI cervical spine: Complex multilevel degenerative disease. No cord compression. Canal stenosis is most pronounced at C7-T1 with the subarachnoid space is effaced but the cord is not deformed and there is no cord edema.  Facet arthropathy on the right at C3-4 that could be focally painful.  Foraminal stenosis bilaterally in the region from C3-4 through C7-T1. The exiting nerve roots could be compressed or irritated as they exit the narrowed foramina. Foraminal stenosis is most severe bilaterally at C7-T1.   Electronically Signed   By: Paulina Fusi M.D.   On: 01/11/2014 20:24   Mr Maxine Glenn Head/brain Wo Cm  01/12/2014   CLINICAL DATA:  53 year old male with right side weakness, discovered to have acute left thalamic lacunar infarct on MRI 12/2013. Initial encounter.  EXAM: MRA HEAD WITHOUT CONTRAST  TECHNIQUE: Angiographic images of the Circle of Willis were obtained using MRA technique without intravenous contrast.  COMPARISON:  Brain MRI 12/2013.  FINDINGS: Antegrade flow in the posterior circulation with mildly dominant distal left vertebral artery. Normal vertebrobasilar junction. Dominant appearing patent AICA origins. No basilar artery stenosis. Normal SCA and PCA origins. Posterior communicating arteries are  diminutive or absent. Bilateral PCA branches are within normal limits; minimal if any irregularity in the left P1 segment.  Antegrade flow in both ICA siphons. No siphon stenosis. Normal ophthalmic artery origins. Patent carotid termini. Dominant right ACA A1 segment. Normal MCA and ACA origins. Normal anterior communicating artery and visualized bilateral ACA branches. Visualized bilateral MCA branches are within normal limits.  IMPRESSION: Negative intracranial MRA.   Electronically Signed   By: Augusto Gamble M.D.   On: 01/12/2014 09:16    Results for orders placed during the hospital encounter of 01/11/14  CBC WITH DIFFERENTIAL      Result Value Ref Range   WBC 4.2  4.0 - 10.5 K/uL   RBC 5.25  4.22 - 5.81 MIL/uL   Hemoglobin 14.5  13.0 - 17.0 g/dL   HCT 16.1  09.6 - 04.5 %   MCV 82.3  78.0 - 100.0 fL   MCH 27.6  26.0 - 34.0 pg   MCHC 33.6  30.0 - 36.0 g/dL   RDW 40.9 (*) 81.1 - 91.4 %   Platelets 192  150 -  400 K/uL   Neutrophils Relative % 46  43 - 77 %   Neutro Abs 1.9  1.7 - 7.7 K/uL   Lymphocytes Relative 40  12 - 46 %   Lymphs Abs 1.7  0.7 - 4.0 K/uL   Monocytes Relative 13 (*) 3 - 12 %   Monocytes Absolute 0.6  0.1 - 1.0 K/uL   Eosinophils Relative 1  0 - 5 %   Eosinophils Absolute 0.1  0.0 - 0.7 K/uL   Basophils Relative 0  0 - 1 %   Basophils Absolute 0.0  0.0 - 0.1 K/uL  BASIC METABOLIC PANEL      Result Value Ref Range   Sodium 139  137 - 147 mEq/L   Potassium 4.3  3.7 - 5.3 mEq/L   Chloride 103  96 - 112 mEq/L   CO2 23  19 - 32 mEq/L   Glucose, Bld 77  70 - 99 mg/dL   BUN 12  6 - 23 mg/dL   Creatinine, Ser 1.61  0.50 - 1.35 mg/dL   Calcium 9.2  8.4 - 09.6 mg/dL   GFR calc non Af Amer >90  >90 mL/min   GFR calc Af Amer >90  >90 mL/min   Anion gap 13  5 - 15  COMPREHENSIVE METABOLIC PANEL      Result Value Ref Range   Sodium 138  137 - 147 mEq/L   Potassium 4.0  3.7 - 5.3 mEq/L   Chloride 99  96 - 112 mEq/L   CO2 26  19 - 32 mEq/L   Glucose, Bld 130 (*) 70 - 99  mg/dL   BUN 14  6 - 23 mg/dL   Creatinine, Ser 0.45  0.50 - 1.35 mg/dL   Calcium 9.3  8.4 - 40.9 mg/dL   Total Protein 7.0  6.0 - 8.3 g/dL   Albumin 3.7  3.5 - 5.2 g/dL   AST 22  0 - 37 U/L   ALT 24  0 - 53 U/L   Alkaline Phosphatase 64  39 - 117 U/L   Total Bilirubin 0.3  0.3 - 1.2 mg/dL   GFR calc non Af Amer 82 (*) >90 mL/min   GFR calc Af Amer >90  >90 mL/min   Anion gap 13  5 - 15  CBC WITH DIFFERENTIAL      Result Value Ref Range   WBC 4.0  4.0 - 10.5 K/uL   RBC 5.34  4.22 - 5.81 MIL/uL   Hemoglobin 14.8  13.0 - 17.0 g/dL   HCT 81.1  91.4 - 78.2 %   MCV 83.1  78.0 - 100.0 fL   MCH 27.7  26.0 - 34.0 pg   MCHC 33.3  30.0 - 36.0 g/dL   RDW 95.6 (*) 21.3 - 08.6 %   Platelets 188  150 - 400 K/uL   Neutrophils Relative % 45  43 - 77 %   Neutro Abs 1.8  1.7 - 7.7 K/uL   Lymphocytes Relative 39  12 - 46 %   Lymphs Abs 1.5  0.7 - 4.0 K/uL   Monocytes Relative 14 (*) 3 - 12 %   Monocytes Absolute 0.5  0.1 - 1.0 K/uL   Eosinophils Relative 1  0 - 5 %   Eosinophils Absolute 0.1  0.0 - 0.7 K/uL   Basophils Relative 1  0 - 1 %   Basophils Absolute 0.0  0.0 - 0.1 K/uL  TSH      Result Value Ref  Range   TSH 1.260  0.350 - 4.500 uIU/mL  HEMOGLOBIN A1C      Result Value Ref Range   Hemoglobin A1C 5.7 (*) <5.7 %   Mean Plasma Glucose 117 (*) <117 mg/dL  LIPID PANEL      Result Value Ref Range   Cholesterol 217 (*) 0 - 200 mg/dL   Triglycerides 88  <409 mg/dL   HDL 76  >81 mg/dL   Total CHOL/HDL Ratio 2.9     VLDL 18  0 - 40 mg/dL   LDL Cholesterol 191 (*) 0 - 99 mg/dL     Assessment and Plan:   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  He is supposed to have neuro follow-up 30 days  HYPERLIPIDEMIA - Plan: Lipid panel, Hepatic function panel. Increased to prava 80, recheck in 3  mo  Other postablative hypothyroidism - Plan: TSH  HYPERTENSION: monitor at home  Other malaise and fatigue  Hyperglycemia - Plan: Hemoglobin A1c, repeat in 3 mo  TOBACCO ABUSE: 5 minutes spent in counselling: The patient does smoke cigarettes, and we discussed that tobacco is harmful to one's overall health, and I made the recommendation to quit tobacco products. Quit at all costs.    Follow-up: f/u 3 months with me in the office  New Prescriptions   No medications on file   Orders Placed This Encounter  Procedures  . Lipid panel  . Hepatic function panel  . TSH  . Hemoglobin A1c    Signed,  Skylen Danielsen T. Jhovanny Guinta, MD   Patient's Medications  New Prescriptions   No medications on file  Previous Medications   ASPIRIN 325 MG TABLET    Take 1 tablet (325 mg total) by mouth daily.   HYDROCHLOROTHIAZIDE (HYDRODIURIL) 25 MG TABLET    Take 25 mg by mouth daily.   LEVOTHYROXINE (SYNTHROID, LEVOTHROID) 125 MCG TABLET    Take 125 mcg by mouth daily before breakfast.   PRAVASTATIN (PRAVACHOL) 80 MG TABLET    Take 1 tablet (80 mg total) by mouth daily.  Modified Medications   No medications on file  Discontinued Medications   No medications on file

## 2014-01-19 NOTE — Progress Notes (Signed)
Pre visit review using our clinic review tool, if applicable. No additional management support is needed unless otherwise documented below in the visit note. 

## 2014-02-18 ENCOUNTER — Encounter: Payer: Self-pay | Admitting: Family Medicine

## 2014-02-18 MED ORDER — PRAVASTATIN SODIUM 80 MG PO TABS: 80.0000 mg | ORAL_TABLET | Freq: Every day | ORAL | Status: DC

## 2014-03-16 ENCOUNTER — Ambulatory Visit (INDEPENDENT_AMBULATORY_CARE_PROVIDER_SITE_OTHER): Payer: BC Managed Care – PPO | Admitting: Neurology

## 2014-03-16 ENCOUNTER — Encounter: Payer: Self-pay | Admitting: Neurology

## 2014-03-16 VITALS — BP 130/102 | HR 80 | Ht 71.0 in | Wt 180.0 lb

## 2014-03-16 DIAGNOSIS — I10 Essential (primary) hypertension: Secondary | ICD-10-CM

## 2014-03-16 DIAGNOSIS — E785 Hyperlipidemia, unspecified: Secondary | ICD-10-CM

## 2014-03-16 DIAGNOSIS — I6309 Cerebral infarction due to thrombosis of other precerebral artery: Secondary | ICD-10-CM

## 2014-03-16 DIAGNOSIS — R200 Anesthesia of skin: Secondary | ICD-10-CM

## 2014-03-16 NOTE — Patient Instructions (Signed)
1. Continue daily aspirin 2. Continue control of cholesterol, BP. Monitor BP regularly 3. Follow-up in 6 months 4. If symptoms worsen or change, go to ER immediately

## 2014-03-16 NOTE — Progress Notes (Signed)
NEUROLOGY CONSULTATION NOTE  Fernando Bell MRN: [id number] DOB: [birth year]  Referring provider: Dr. Rhetta Mura Primary care provider: Dr. Hannah Beat  Reason for consult:  Recent stroke  Dear Dr Mahala Menghini:  Thank you for your kind referral of Fernando Bell for consultation of the above symptoms. Although his history is well known to you, please allow me to reiterate it for the purpose of our medical record. Records and images were personally reviewed where available.  HISTORY OF PRESENT ILLNESS: This is a pleasant 53 year old right-handed man with a history of hypertension, hyperlipidemia, tobacco use, in his usual state of health until 01/07/2014 when he felt his right leg/foot go numb and "fall asleep." He kept walking with no weakness noted, and noticed that his right forearm and right lateral abdominal region were also tingling and numb. He had brief tingling over the right cheek that resolved. He continued to work and drove out of state, however due to persistent symptoms, he went to the ER on 01/11/14 where MRI brain showed a small acute infarct in the left thalamus. He underwent stroke workup, CBC and CMP were unremarkable. Lipid panel showed a total cholesterol of 268, LDL 187. HbA1c 5.7. I personally reviewed MRI brain showing the 3mm acute infarct in the left lateral thalamus. MRA brain did not show any significant intracranial stenosis, carotid dopplers showed 1-39% stenosis of bilateral internal carotid arteries. Echocardiogram normal EF, normal left atrium size. He had stopped daily baby aspirin in August, and this was resumed to full dose aspirin during his hospital stay.  His Pravachol dose was increased.   Since hospital discharge, he continues to have constant tingling over the right lateral abdominal area. The paresthesias in his right foot and forearm are intermittent and only last a few minutes then resolve. No further facial symptoms. Since the stroke, he has had  headaches that start at the base of the head then radiate mainly to the right vertex region. Headaches are described as sharp and throbbing, lasting 3-4 hours up to the whole day. If more severe, he would take a Motrin and try to calm down. There is some phonophobia, no nausea, vomiting, photophobia. He has neck pain and low back pain. He passed kidney stones last weekend, otherwise no bowel/bladder issues. There is a family history of stroke in his older sister who passed away from a pontine stroke at age 28. His father had a heart transplant at age 55.  Laboratory Data: Lab Results  Component Value Date   WBC 4.0 01/12/2014   HGB 14.8 01/12/2014   HCT 44.4 01/12/2014   MCV 83.1 01/12/2014   PLT 188 01/12/2014     Chemistry      Component Value Date/Time   NA 138 01/12/2014 0915   K 4.0 01/12/2014 0915   CL 99 01/12/2014 0915   CO2 26 01/12/2014 0915   BUN 14 01/12/2014 0915   CREATININE 1.02 01/12/2014 0915      Component Value Date/Time   CALCIUM 9.3 01/12/2014 0915   ALKPHOS 64 01/12/2014 0915   AST 22 01/12/2014 0915   ALT 24 01/12/2014 0915   BILITOT 0.3 01/12/2014 0915      PAST MEDICAL HISTORY: Past Medical History  Diagnosis Date  . GRAVES' DISEASE 07/28/2009  . HYPERTHYROIDISM 07/27/2009  . HYPERLIPIDEMIA 07/05/2008  . TOBACCO ABUSE 04/23/2008  . HYPERTENSION 04/23/2008  . BACK PAIN 04/23/2008  . Other malaise and fatigue 06/29/2009  . Proteinuria 01/25/2009  . 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  Blood transfusion 1993    after hemorrhoid surgery  . Recurrent kidney stones     PAST SURGICAL HISTORY: Past Surgical History  Procedure Laterality Date  . Relocation of left undescended testicle at age 53    . Hemorrhoid surgery  1993  . Av fistula repair  1993  . Small intestine surgery  1993    remote bowel surgeries >15 years ago    MEDICATIONS: Current Outpatient Prescriptions on File Prior to Visit  Medication Sig Dispense Refill  . aspirin 325 MG  tablet Take 1 tablet (325 mg total) by mouth daily.    . hydrochlorothiazide (HYDRODIURIL) 25 MG tablet Take 25 mg by mouth daily.    Marland Kitchen. levothyroxine (SYNTHROID, LEVOTHROID) 125 MCG tablet Take 125 mcg by mouth daily before breakfast.    . pravastatin (PRAVACHOL) 80 MG tablet Take 1 tablet (80 mg total) by mouth daily. 30 tablet 11   No current facility-administered medications on file prior to visit.    ALLERGIES: Allergies  Allergen Reactions  . Iodine Shortness Of Breath    FAMILY HISTORY: Family History  Problem Relation Age of Onset  . Graves' disease Daughter   . Stroke Sister   . CAD Father     SOCIAL HISTORY: History   Social History  . Marital Status: Married    Spouse Name: N/A    Number of Children: N/A  . Years of Education: N/A   Occupational History  . [employer] doctorDriver    Social History Main Topics  . Smoking status: Current Every Day Smoker -- 0.75 packs/day    Types: Cigarettes  . Smokeless tobacco: Former NeurosurgeonUser  . Alcohol Use: Yes     Comment: once a month  . Drug Use: No  . Sexual Activity: Not on file   Other Topics Concern  . Not on file   Social History Narrative    REVIEW OF SYSTEMS: Constitutional: No fevers, chills, or sweats, no generalized fatigue, change in appetite Eyes: No visual changes, double vision, eye pain Ear, nose and throat: No hearing loss, ear pain, nasal congestion, sore throat Cardiovascular: No chest pain, palpitations Respiratory:  No shortness of breath at rest or with exertion, wheezes GastrointestinaI: No nausea, vomiting, diarrhea, abdominal pain, fecal incontinence Genitourinary:  No dysuria, urinary retention or frequency Musculoskeletal:  + neck pain, back pain Integumentary: No rash, pruritus, skin lesions Neurological: as above Psychiatric: No depression, insomnia, anxiety Endocrine: No palpitations, fatigue, diaphoresis, mood swings, change in appetite, change in weight, increased  thirst Hematologic/Lymphatic:  No anemia, purpura, petechiae. Allergic/Immunologic: no itchy/runny eyes, nasal congestion, recent allergic reactions, rashes  PHYSICAL EXAM: Filed Vitals:   03/16/14 1347  BP: 130/102  Pulse:    General: No acute distress Head:  Normocephalic/atraumatic Eyes: Fundoscopic exam shows bilateral sharp discs, no vessel changes, exudates, or hemorrhages Neck: supple, no paraspinal tenderness, full range of motion Back: No paraspinal tenderness Heart: regular rate and rhythm Lungs: Clear to auscultation bilaterally. Vascular: No carotid bruits. Skin/Extremities: No rash, no edema Neurological Exam: Mental status: alert and oriented to person, place, and time, no dysarthria or aphasia, Fund of knowledge is appropriate.  Recent and remote memory are intact.  Attention and concentration are normal.    Able to name objects and repeat phrases. Cranial nerves: CN I: not tested CN II: pupils equal, round and reactive to light, visual fields intact, fundi unremarkable. CN III, IV, VI:  full range of motion, no nystagmus, no ptosis CN V: facial sensation intact CN VII:  upper and lower face symmetric CN VIII: hearing intact to finger rub CN IX, X: gag intact, uvula midline CN XI: sternocleidomastoid and trapezius muscles intact CN XII: tongue midline Bulk & Tone: normal, no fasciculations. Motor: 5/5 throughout with no pronator drift. Sensation: reports increased cold sensation on the right UE, decreased cold on both calves, otherwise intact to light touch, pin, vibration and joint position sense.  No extinction to double simultaneous stimulation. Intact to pin and cold over right lateral abdominal region where he feels constant tingling.  Romberg test negative Deep Tendon Reflexes: +2 throughout, no ankle clonus Plantar responses: downgoing bilaterally Cerebellar: no incoordination on finger to nose, heel to shin. No dysdiadochokinesia Gait: narrow-based and  steady, able to tandem walk adequately. Tremor: none  IMPRESSION: This is a pleasant 53 year old right-handed man with a history of hypertension, hyperlipidemia, tobacco use, presenting for hospital follow-up after he had right-sided sensory symptoms. MRI brain showed small left lateral thalamic infarct, likely due to small vessel disease. Stroke workup unremarkable except for elevated LDL. His Pravachol dose was increased. His BP today is elevated, he reports he has a cuff at home and BP usually normal. Continue to monitor BP regularly. Continue daily aspirin and control of vascular risk factors for secondary stroke prevention, smoking cessation. He knows to go to the ER if symptoms progress or change. He will follow-up in 6 months.  Thank you for allowing me to participate in the care of this patient. Please do not hesitate to call for any questions or concerns.   Patrcia DollyKaren Velena Keegan, M.D.  CC: Dr. Patsy Lageropland

## 2014-03-17 ENCOUNTER — Encounter: Payer: Self-pay | Admitting: Neurology

## 2014-04-13 ENCOUNTER — Other Ambulatory Visit (INDEPENDENT_AMBULATORY_CARE_PROVIDER_SITE_OTHER): Payer: BC Managed Care – PPO

## 2014-04-13 DIAGNOSIS — E890 Postprocedural hypothyroidism: Secondary | ICD-10-CM

## 2014-04-13 DIAGNOSIS — R739 Hyperglycemia, unspecified: Secondary | ICD-10-CM

## 2014-04-13 DIAGNOSIS — E785 Hyperlipidemia, unspecified: Secondary | ICD-10-CM

## 2014-04-13 LAB — HEPATIC FUNCTION PANEL
ALBUMIN: 4.3 g/dL (ref 3.5–5.2)
ALT: 24 U/L (ref 0–53)
AST: 21 U/L (ref 0–37)
Alkaline Phosphatase: 64 U/L (ref 39–117)
Bilirubin, Direct: 0.1 mg/dL (ref 0.0–0.3)
Total Bilirubin: 0.7 mg/dL (ref 0.2–1.2)
Total Protein: 7.6 g/dL (ref 6.0–8.3)

## 2014-04-13 LAB — LIPID PANEL
Cholesterol: 182 mg/dL (ref 0–200)
HDL: 55.5 mg/dL (ref >39.00)
LDL Cholesterol: 106 mg/dL — ABNORMAL HIGH (ref 0–99)
NONHDL: 126.5
Total CHOL/HDL Ratio: 3
Triglycerides: 105 mg/dL (ref 0.0–149.0)
VLDL: 21 mg/dL (ref 0.0–40.0)

## 2014-04-13 LAB — HEMOGLOBIN A1C: Hgb A1c MFr Bld: 6.1 % (ref 4.6–6.5)

## 2014-04-14 LAB — TSH: TSH: 0.2 u[IU]/mL — AB (ref 0.35–4.50)

## 2014-04-15 ENCOUNTER — Encounter: Payer: Self-pay | Admitting: Family Medicine

## 2014-04-15 ENCOUNTER — Ambulatory Visit (INDEPENDENT_AMBULATORY_CARE_PROVIDER_SITE_OTHER): Payer: BC Managed Care – PPO | Admitting: Family Medicine

## 2014-04-15 VITALS — BP 144/90 | HR 74 | Temp 98.4°F | Ht 70.5 in | Wt 184.5 lb

## 2014-04-15 DIAGNOSIS — F17200 Nicotine dependence, unspecified, uncomplicated: Secondary | ICD-10-CM

## 2014-04-15 DIAGNOSIS — E785 Hyperlipidemia, unspecified: Secondary | ICD-10-CM

## 2014-04-15 DIAGNOSIS — I10 Essential (primary) hypertension: Secondary | ICD-10-CM

## 2014-04-15 DIAGNOSIS — Z720 Tobacco use: Secondary | ICD-10-CM

## 2014-04-15 DIAGNOSIS — E890 Postprocedural hypothyroidism: Secondary | ICD-10-CM

## 2014-04-15 DIAGNOSIS — E039 Hypothyroidism, unspecified: Secondary | ICD-10-CM

## 2014-04-15 HISTORY — DX: Hypothyroidism, unspecified: E03.9

## 2014-04-15 MED ORDER — LISINOPRIL 10 MG PO TABS: 10.0000 mg | ORAL_TABLET | Freq: Every day | ORAL | Status: DC

## 2014-04-15 MED ORDER — LEVOTHYROXINE SODIUM 112 MCG PO TABS: 112.0000 ug | ORAL_TABLET | Freq: Every day | ORAL | Status: DC

## 2014-04-15 NOTE — Progress Notes (Signed)
Pre visit review using our clinic review tool, if applicable. No additional management support is needed unless otherwise documented below in the visit note. 

## 2014-04-15 NOTE — Progress Notes (Signed)
Dr. Karleen HampshireSpencer T. Amoy Steeves, MD, CAQ Sports Medicine Primary Care and Sports Medicine [address] [place], [id number] Phone: [phone] Fax: [phone]  04/15/2014  Patient: Fernando Bell, MRN: [id number], DOB: 1960-10-06, 53 y.o.  Primary Physician:  Hannah BeatSpencer Nyiesha Beever, MD  Chief Complaint: Follow-up  Subjective:   Fernando Bell is a 53 y.o. very pleasant male patient who presents with the following:  F/u CVA, HTN:  BP Readings from Last 3 Encounters:  04/15/14 144/90  03/16/14 130/102  01/19/14 130/92    BP is still high.  HTN: Tolerating all medications without side effects No CP, no sob. No HA.  Prediabetes - doing better with diet  Wt Readings from Last 3 Encounters:  04/15/14 184 lb 8 oz (83.689 kg)  03/16/14 180 lb (81.647 kg)  01/19/14 177 lb 4 oz (80.4 kg)    Lab Results  Component Value Date   HGBA1C 6.1 04/13/2014    Thyroid: No symptoms. Labs reviewed. Denies cold / heat intolerance, dry skin, hair loss. No goiter.  Lab Results  Component Value Date   TSH 0.20* 04/13/2014    Ongoing tobacco use  Lipids: Doing well, stable. Tolerating meds fine with no SE. Panel reviewed with patient.  Lipids:    Component Value Date/Time   CHOL 182 04/13/2014 0806   TRIG 105.0 04/13/2014 0806   HDL 55.50 04/13/2014 0806   LDLDIRECT 170.1 08/14/2011 1103   VLDL 21.0 04/13/2014 0806   CHOLHDL 3 04/13/2014 0806    Lab Results  Component Value Date   ALT 24 04/13/2014   AST 21 04/13/2014   ALKPHOS 64 04/13/2014   BILITOT 0.7 04/13/2014     Past Medical History, Surgical History, Social History, Family History, Problem List, Medications, and Allergies have been reviewed and updated if relevant.   GEN: No acute illnesses, no fevers, chills. GI: No n/v/d, eating normally Pulm: No SOB Interactive and getting along well at home.  Otherwise, ROS is as per the HPI.  Objective:   BP 144/90 mmHg  Pulse 74  Temp(Src) 98.4 F (36.9 C) (Oral)  Ht 5'  10.5" (1.791 m)  Wt 184 lb 8 oz (83.689 kg)  BMI 26.09 kg/m2  SpO2 97%  GEN: WDWN, NAD, Non-toxic, A & O x 3 HEENT: Atraumatic, Normocephalic. Neck supple. No masses, No LAD. Ears and Nose: No external deformity. CV: RRR, No M/G/R. No JVD. No thrill. No extra heart sounds. PULM: CTA B, no wheezes, crackles, rhonchi. No retractions. No resp. distress. No accessory muscle use. EXTR: No c/c/e NEURO Normal gait.  PSYCH: Normally interactive. Conversant. Not depressed or anxious appearing.  Calm demeanor.   Laboratory and Imaging Data: Results for orders placed or performed in visit on 04/13/14  Lipid panel  Result Value Ref Range   Cholesterol 182 0 - 200 mg/dL   Triglycerides 865.7105.0 0.0 - 149.0 mg/dL   HDL 84.6955.50 >62.95>39.00 mg/dL   VLDL 28.421.0 0.0 - 13.240.0 mg/dL   LDL Cholesterol 440106 (H) 0 - 99 mg/dL   Total CHOL/HDL Ratio 3    NonHDL 126.50   Hepatic function panel  Result Value Ref Range   Total Bilirubin 0.7 0.2 - 1.2 mg/dL   Bilirubin, Direct 0.1 0.0 - 0.3 mg/dL   Alkaline Phosphatase 64 39 - 117 U/L   AST 21 0 - 37 U/L   ALT 24 0 - 53 U/L   Total Protein 7.6 6.0 - 8.3 g/dL   Albumin 4.3 3.5 - 5.2 g/dL  TSH  Result Value Ref Range   TSH 0.20 (L) 0.35 - 4.50 uIU/mL  Hemoglobin A1c  Result Value Ref Range   Hgb A1c MFr Bld 6.1 4.6 - 6.5 %     Assessment and Plan:   Essential hypertension  Hypothyroidism, postradioiodine therapy  Hyperlipidemia LDL goal <70  TOBACCO ABUSE  Additional hypertensive agent.  Decrease Synthroid dosing.  Counseled regarding discontinuation of tobacco.  Counseled regarding ongoing need for diet and exercise.  Cholesterol has improved.  Follow-up: Return in about 3 months (around 07/15/2014).  New Prescriptions   LISINOPRIL (PRINIVIL,ZESTRIL) 10 MG TABLET    Take 1 tablet (10 mg total) by mouth daily.   No orders of the defined types were placed in this encounter.    Signed,  Elpidio GaleaSpencer T. Dmani Mizer, MD   Patient's Medications  New  Prescriptions   LISINOPRIL (PRINIVIL,ZESTRIL) 10 MG TABLET    Take 1 tablet (10 mg total) by mouth daily.  Previous Medications   ASPIRIN 325 MG TABLET    Take 1 tablet (325 mg total) by mouth daily.   HYDROCHLOROTHIAZIDE (HYDRODIURIL) 25 MG TABLET    Take 25 mg by mouth daily.   PRAVASTATIN (PRAVACHOL) 80 MG TABLET    Take 1 tablet (80 mg total) by mouth daily.  Modified Medications   Modified Medication Previous Medication   LEVOTHYROXINE (SYNTHROID, LEVOTHROID) 112 MCG TABLET levothyroxine (SYNTHROID, LEVOTHROID) 125 MCG tablet      Take 1 tablet (112 mcg total) by mouth daily before breakfast.    Take 125 mcg by mouth daily before breakfast.  Discontinued Medications   No medications on file

## 2014-04-16 ENCOUNTER — Telehealth: Payer: Self-pay | Admitting: Family Medicine

## 2014-04-16 NOTE — Telephone Encounter (Signed)
emmi emailed °

## 2014-05-20 ENCOUNTER — Telehealth: Payer: Self-pay | Admitting: Family Medicine

## 2014-05-20 ENCOUNTER — Encounter: Payer: Self-pay | Admitting: Family Medicine

## 2014-05-20 MED ORDER — HYDROCHLOROTHIAZIDE 25 MG PO TABS: 25.0000 mg | ORAL_TABLET | Freq: Every day | ORAL | Status: DC

## 2014-05-20 NOTE — Telephone Encounter (Signed)
Pt call checking on rx for his hctz.  Pt is completely out of his meds and would like to get a refill Please advise pt when this is called in [facility] [place] [phone] option 0 Pt also sent my chart message about this

## 2014-05-20 NOTE — Telephone Encounter (Signed)
Patient notified via MyChart that prescription has been [place] to [facility] [place] requested.

## 2014-06-22 ENCOUNTER — Encounter: Payer: Self-pay | Admitting: Family Medicine

## 2014-06-22 MED ORDER — HYDROCHLOROTHIAZIDE 25 MG PO TABS: 25.0000 mg | ORAL_TABLET | Freq: Every day | ORAL | Status: DC

## 2014-07-13 ENCOUNTER — Other Ambulatory Visit: Payer: BC Managed Care – PPO

## 2014-07-15 ENCOUNTER — Ambulatory Visit: Payer: BC Managed Care – PPO | Admitting: Family Medicine

## 2014-07-15 ENCOUNTER — Other Ambulatory Visit: Payer: Self-pay | Admitting: *Deleted

## 2014-07-15 MED ORDER — LISINOPRIL 10 MG PO TABS: 10.0000 mg | ORAL_TABLET | Freq: Every day | ORAL | Status: DC

## 2014-07-15 MED ORDER — LEVOTHYROXINE SODIUM 112 MCG PO TABS: 112.0000 ug | ORAL_TABLET | Freq: Every day | ORAL | Status: DC

## 2014-07-15 NOTE — Addendum Note (Signed)
Addended by: Damita LackLORING, Jarvis Knodel S on: 07/15/2014 03:47 PM   Modules accepted: Orders

## 2014-08-04 ENCOUNTER — Other Ambulatory Visit: Payer: Self-pay | Admitting: Family Medicine

## 2014-08-04 DIAGNOSIS — E785 Hyperlipidemia, unspecified: Secondary | ICD-10-CM

## 2014-08-04 DIAGNOSIS — I519 Heart disease, unspecified: Principal | ICD-10-CM

## 2014-08-04 DIAGNOSIS — R7303 Prediabetes: Secondary | ICD-10-CM

## 2014-08-04 DIAGNOSIS — Z79899 Other long term (current) drug therapy: Secondary | ICD-10-CM

## 2014-08-04 DIAGNOSIS — E039 Hypothyroidism, unspecified: Secondary | ICD-10-CM

## 2014-08-05 ENCOUNTER — Other Ambulatory Visit: Payer: Self-pay

## 2014-08-06 ENCOUNTER — Ambulatory Visit: Payer: Self-pay | Admitting: Family Medicine

## 2014-08-11 ENCOUNTER — Other Ambulatory Visit (INDEPENDENT_AMBULATORY_CARE_PROVIDER_SITE_OTHER): Payer: BLUE CROSS/BLUE SHIELD

## 2014-08-11 DIAGNOSIS — Z79899 Other long term (current) drug therapy: Secondary | ICD-10-CM

## 2014-08-11 DIAGNOSIS — R7309 Other abnormal glucose: Secondary | ICD-10-CM

## 2014-08-11 DIAGNOSIS — R7303 Prediabetes: Secondary | ICD-10-CM

## 2014-08-11 DIAGNOSIS — E785 Hyperlipidemia, unspecified: Secondary | ICD-10-CM

## 2014-08-11 DIAGNOSIS — E039 Hypothyroidism, unspecified: Secondary | ICD-10-CM

## 2014-08-11 DIAGNOSIS — I519 Heart disease, unspecified: Secondary | ICD-10-CM

## 2014-08-11 LAB — BASIC METABOLIC PANEL
BUN: 18 mg/dL (ref 6–23)
CALCIUM: 9.8 mg/dL (ref 8.4–10.5)
CHLORIDE: 103 meq/L (ref 96–112)
CO2: 28 mEq/L (ref 19–32)
Creatinine, Ser: 1.12 mg/dL (ref 0.40–1.50)
GFR: 87.68 mL/min (ref >60.00)
Glucose, Bld: 110 mg/dL — ABNORMAL HIGH (ref 70–99)
POTASSIUM: 3.7 meq/L (ref 3.5–5.1)
Sodium: 138 mEq/L (ref 135–145)

## 2014-08-11 LAB — HEMOGLOBIN A1C: HEMOGLOBIN A1C: 5.8 % (ref 4.6–6.5)

## 2014-08-11 LAB — LIPID PANEL
CHOL/HDL RATIO: 3
Cholesterol: 189 mg/dL (ref 0–200)
HDL: 68 mg/dL (ref >39.00)
LDL CALC: 105 mg/dL — AB (ref 0–99)
NONHDL: 121
TRIGLYCERIDES: 82 mg/dL (ref 0.0–149.0)
VLDL: 16.4 mg/dL (ref 0.0–40.0)

## 2014-08-11 LAB — TSH: TSH: 0.17 u[IU]/mL — ABNORMAL LOW (ref 0.35–4.50)

## 2014-08-12 ENCOUNTER — Encounter: Payer: Self-pay | Admitting: Family Medicine

## 2014-08-12 ENCOUNTER — Ambulatory Visit (INDEPENDENT_AMBULATORY_CARE_PROVIDER_SITE_OTHER): Payer: BLUE CROSS/BLUE SHIELD | Admitting: Family Medicine

## 2014-08-12 VITALS — BP 120/80 | HR 91 | Temp 98.3°F | Ht 70.5 in | Wt 175.2 lb

## 2014-08-12 DIAGNOSIS — I639 Cerebral infarction, unspecified: Secondary | ICD-10-CM | POA: Diagnosis not present

## 2014-08-12 DIAGNOSIS — I10 Essential (primary) hypertension: Secondary | ICD-10-CM

## 2014-08-12 DIAGNOSIS — E785 Hyperlipidemia, unspecified: Secondary | ICD-10-CM | POA: Diagnosis not present

## 2014-08-12 DIAGNOSIS — E890 Postprocedural hypothyroidism: Secondary | ICD-10-CM | POA: Diagnosis not present

## 2014-08-12 NOTE — Progress Notes (Signed)
Pre visit review using our clinic review tool, if applicable. No additional management support is needed unless otherwise documented below in the visit note. 

## 2014-08-12 NOTE — Progress Notes (Signed)
Dr. [name] T. Ezariah Nace, MD, CAQ Sports Medicine Primary Care and Sports Medicine [address] [place], [address] Phone: [phone] Fax: [phone]  08/12/2014  Patient: Fernando Bell, MRN: [id number], DOB: [birth year], 54 y.o.  Primary Physician:  Hannah Beat, MD  Chief Complaint: Follow-up  Subjective:   Fernando Bell is a 54 y.o. very pleasant male patient who presents with the following:  Wt Readings from Last 3 Encounters:  08/12/14 175 lb 4 oz (79.493 kg)  04/15/14 184 lb 8 oz (83.689 kg)  03/16/14 180 lb (81.647 kg)    Thyroid: No symptoms. Labs reviewed. Denies cold / heat intolerance, dry skin, hair loss. No goiter.  Lab Results  Component Value Date   TSH 0.17* 08/11/2014    HTN: Tolerating all medications without side effects Stable and at goal No CP, no sob. No HA.  BP Readings from Last 3 Encounters:  08/12/14 120/80  04/15/14 144/90  03/16/14 130/102    Basic Metabolic Panel:    Component Value Date/Time   NA 138 08/11/2014 0903   K 3.7 08/11/2014 0903   CL 103 08/11/2014 0903   CO2 28 08/11/2014 0903   BUN 18 08/11/2014 0903   CREATININE 1.12 08/11/2014 0903   GLUCOSE 110* 08/11/2014 0903   CALCIUM 9.8 08/11/2014 0903   Lipids: Doing well, stable. Tolerating meds fine with no SE. Panel reviewed with patient.  Lipids:    Component Value Date/Time   CHOL 189 08/11/2014 0903   TRIG 82.0 08/11/2014 0903   HDL 68.00 08/11/2014 0903   LDLDIRECT 170.1 08/14/2011 1103   VLDL 16.4 08/11/2014 0903   CHOLHDL 3 08/11/2014 0903    Lab Results  Component Value Date   ALT 24 04/13/2014   AST 21 04/13/2014   ALKPHOS 64 04/13/2014   BILITOT 0.7 04/13/2014    Poststroke, he is doing well. His strength is returning he is really do quite well per his report.  Past Medical History, Surgical History, Social History, Family History, Problem List, Medications, and Allergies have been reviewed and updated if relevant.   GEN: No acute  illnesses, no fevers, chills. GI: No n/v/d, eating normally Pulm: No SOB Interactive and getting along well at home.  Otherwise, ROS is as per the HPI.  Objective:   BP 120/80 mmHg  Pulse 91  Temp(Src) 98.3 F (36.8 C) (Oral)  Ht 5' 10.5" (1.791 m)  Wt 175 lb 4 oz (79.493 kg)  BMI 24.78 kg/m2  GEN: WDWN, NAD, Non-toxic, A & O x 3 HEENT: Atraumatic, Normocephalic. Neck supple. No masses, No LAD. Ears and Nose: No external deformity. CV: RRR, No M/G/R. No JVD. No thrill. No extra heart sounds. PULM: CTA B, no wheezes, crackles, rhonchi. No retractions. No resp. distress. No accessory muscle use. EXTR: No c/c/e NEURO Normal gait.  PSYCH: Normally interactive. Conversant. Not depressed or anxious appearing.  Calm demeanor.   Laboratory and Imaging Data:  Assessment and Plan:   Hypothyroidism, postradioiodine therapy  Essential hypertension  Hyperlipidemia LDL goal <70  Cerebral infarction due to unspecified mechanism  Add Free T3 and T4 and adjust if needed based on these.   All else stable  Follow-up: Return in about 6 months (around 02/11/2015).  New Prescriptions   No medications on file   No orders of the defined types were placed in this encounter.    Signed,  Elpidio Galea. Hobert Poplaski, MD   Patient's Medications  New Prescriptions   No medications on file  Previous Medications  ASPIRIN 325 MG TABLET    Take 1 tablet (325 mg total) by mouth daily.   HYDROCHLOROTHIAZIDE (HYDRODIURIL) 25 MG TABLET    Take 1 tablet (25 mg total) by mouth daily.   LEVOTHYROXINE (SYNTHROID, LEVOTHROID) 112 MCG TABLET    Take 1 tablet (112 mcg total) by mouth daily before breakfast.   LISINOPRIL (PRINIVIL,ZESTRIL) 10 MG TABLET    Take 1 tablet (10 mg total) by mouth daily.   PRAVASTATIN (PRAVACHOL) 80 MG TABLET    Take 1 tablet (80 mg total) by mouth daily.  Modified Medications   No medications on file  Discontinued Medications   No medications on file

## 2014-08-13 ENCOUNTER — Other Ambulatory Visit (INDEPENDENT_AMBULATORY_CARE_PROVIDER_SITE_OTHER): Payer: BLUE CROSS/BLUE SHIELD

## 2014-08-13 DIAGNOSIS — E0500 Thyrotoxicosis with diffuse goiter without thyrotoxic crisis or storm: Secondary | ICD-10-CM

## 2014-08-13 LAB — T3, FREE: T3, Free: 3.2 pg/mL (ref 2.3–4.2)

## 2014-08-13 LAB — T4, FREE: FREE T4: 1.12 ng/dL (ref 0.60–1.60)

## 2014-08-15 NOTE — Discharge Summary (Signed)
PATIENT NAME:  Fernando Bell, Fernando Bell MR#:  [id number] DATE OF BIRTH:  05-Jul-1960  DATE OF ADMISSION:  05/20/2013 DATE OF DISCHARGE:  05/20/2013  DISCHARGE DIAGNOSES: 1. Musculoskeletal chest pain.  2. Hypertension.   DISCHARGE MEDICATIONS:  1. Hydrochlorothiazide 25 mg p.o. daily.  2. Levothyroxine 125 mcg p.o. daily.   CONSULTATIONS: None.   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 Because of his age, hypertension and family history of coronary artery disease, he was admitted to telemetry for chest pain rule-out. The patient's EKG did not show any acute changes. The patient's troponins have been negative x3. He is started on aspirin, beta blockers and statins. The patient's chest pain relieved the next day, and the patient's D-dimer was high at 0.90, so he had a V/Q scan. He could not get CT chest with contrast because HE IS ALLERGIC TO IV DYE. The patient's V/Q scan did not show any pulmonary emboli. The patient's LDL is elevated to 130, but he said it was around 200s before. Because of intolerance to statins, the way he feels, then he refused to take prescription for statins. He said he already has a prescription for statins at home, so he said he is going to follow up with the primary doctor and make sure his lipids will come down, and he said he will be compliant with his diet. The patient's chest pain resolved, and thought to be musculoskeletal chest pain. The patient's blood pressure at the time of discharge 136/83, pulse is 79, and the patient discharged home in stable condition.   TIME SPENT: More than 30 minutes.   ____________________________ Katha HammingSnehalatha Jenniffer Vessels, MD sk:lb D: 05/22/2013 08:41:41 ET T: 05/22/2013 08:58:52 ET JOB#: [id number]  cc: Katha HammingSnehalatha Aleyah Balik, MD, <Dictator> Katha HammingSNEHALATHA Lanora Reveron MD ELECTRONICALLY SIGNED 06/03/2013 15:29

## 2014-08-15 NOTE — H&P (Signed)
PATIENT NAME:  Fernando Bell, Fernando Bell MR#:  [id number] DATE OF BIRTH:  1960-11-16  DATE OF ADMISSION:  05/20/2013  PRIMARY CARE PHYSICIAN: Dr. Dallas Schimkeopeland.   REFERRING PHYSICIAN: Dr. Cyril LoosenKinner.   CHIEF COMPLAINT: Chest pain.   HISTORY OF PRESENT ILLNESS: The patient is a 54 year old African American male with a past medical history of hypertension, hyperlipidemia and hypothyroidism who is presenting to the ER with a chief complaint of left-sided chest pain for the past 7 to 10 days. The patient is reporting that he has been experiencing aching chest pain on the left side of the chest with no radiation for the past 7 to 10 days. He denies any shortness of breath associated with it. He denies any dizziness, nausea or vomiting. He tried over-the-counter ibuprofen with some relief. He has presented to the ER as the chest pain is not completely going away. The patient has reported that he does not have any cardiac history and not seen by any cardiologist in the past. The patient's first 2 troponins were negative, and hospitalist team is called to admit the patient. During my examination, the patient is resting comfortably. Denies any shortness of breath and complaining of dull constant chest pain on the left side.   PAST MEDICAL HISTORY: Hypertension, hyperlipidemia, hypothyroidism.   PAST SURGICAL HISTORY: Lithotripsy x 5.   ALLERGIES: IV DYE.   PSYCHOSOCIAL HISTORY: Lives with wife. Smokes 1 pack a day. Occasional intake of alcohol. Denies any illicit drug usage.   FAMILY HISTORY: Dad deceased with heart attack. Mother has history of hypertension.   HOME MEDICATIONS: No currently on any home meds.   REVIEW OF SYSTEMS:  CONSTITUTIONAL: Denies fever, fatigue.  EYES: Denies blurry vision, double vision.  ENT: Denies epistaxis, discharge.  RESPIRATION: Denies cough, COPD.   CARDIOVASCULAR: Complaining of left-sided chest pain with no radiation for the past 7 to 10 days. No palpitations.  GASTROINTESTINAL:  Denies nausea, vomiting, diarrhea. No hematemesis, no melena.  NEUROLOGIC: No vertigo. No ataxia.  PSYCHIATRIC: No ADD, OCD.  ENDOCRINE: Denies polyuria, nocturia, thyroid problems.  MUSCULOSKELETAL: No joint effusion, tenderness, erythema.  HEMATOLOGIC AND LYMPHATIC: No anemia, easy bruising, bleeding.   PHYSICAL EXAMINATION:  VITAL SIGNS: Temperature 98.5, respirations 17, blood pressure 149/98, pulse ox is 98%.  GENERAL APPEARANCE: Not in any acute distress. Moderately built and nourished.  HEENT: Normocephalic, atraumatic. Pupils are equally reactive to light and accommodation. No scleral icterus. No conjunctival injection. No sinus tenderness. No postnasal drip.  NECK: Supple. No JVD. No thyromegaly. Range of motion is intact.  LUNGS: Clear to auscultation bilaterally. No accessory muscle usage. No anterior chest wall tenderness on palpation.  CARDIAC: S1, S2 normal. Regular rate and rhythm. No murmurs.  GASTROINTESTINAL: Soft. Bowel sounds are positive in all 4 quadrants. Nontender, nondistended. No hepatosplenomegaly. No masses felt.  NEUROLOGIC: Awake, alert, oriented x 3. Motor and sensory grossly intact. Reflexes are 2+.  EXTREMITIES: No edema. No cyanosis. No clubbing.  SKIN: Warm to touch. Normal turgor. No rashes. No lesions. No peripheral edema.  MUSCULOSKELETAL: No joint effusion, tenderness, erythema. Peripheral pulses are 2+.  PSYCHIATRIC: Normal mood and affect.   LABORATORIES AND IMAGING STUDIES: CPK-MB 0.8, troponin less than 0.02 x 2. CBC is normal. D-dimer is elevated at 0.90. Chem-8: Glucose is elevated at 147, BUN 15, creatinine 1.10, sodium 135, potassium 3.4, chloride 102, CO2 27. GFR is greater than 60. Calcium and osmolality are normal. A 12-lead EKG: Normal sinus rhythm. No acute ST-T wave changes. Chest x-ray, PA and  lateral views: No acute abnormalities, COPD changes.   ASSESSMENT AND PLAN: A 54 year old African American male presenting to the Emergency Room with  a chief complaint of 7 to 10 day history of constant left-sided chest pain with no radiation. Will be admitted with the following assessment and plan:  1. Chest pain: Rule out acute myocardial infarction. Differential diagnosis can be pulmonary embolism as D-dimer is elevated. Admit him to telemetry. Cycle cardiac biomarkers. Acute coronary syndrome protocol with oxygen, nitroglycerin, aspirin, beta blocker and statin. Will obtain echocardiogram. Cardiology consult is placed to [facility] cardiology group. V/Q scan is pending. We could not get a CT angiogram of the chest as the patient is allergic to IV DYE. The patient has received 1 dose of Lovenox 1 mg/kg subcutaneous in the Emergency Room until V/Q scan is done.  2. Hypertension: Blood pressure is stable. The patient is on beta blocker.  3. Hyperlipidemia: Will check fasting lipid panel and will provide him statin.  4. Hypothyroidism: The patient is not on any medication. Will check TSH.  5. Will provide gastrointestinal and deep vein thrombosis prophylaxis.   He is FULL CODE. Wife is medical power of attorney.   Diagnosis and plan of care was discussed in detail with the patient. He is aware of plan.   ____________________________ Ramonita Lab, MD ag:gb D: 05/20/2013 03:00:54 ET T: 05/20/2013 05:29:27 ET JOB#: [id number]  cc: Ramonita Lab, MD, <Dictator> Dr. Providence Lanius MD ELECTRONICALLY SIGNED 06/01/2013 2:11

## 2014-08-18 ENCOUNTER — Encounter: Payer: Self-pay | Admitting: Family Medicine

## 2014-08-18 MED ORDER — HYDROCHLOROTHIAZIDE 25 MG PO TABS: 25.0000 mg | ORAL_TABLET | Freq: Every day | ORAL | Status: DC

## 2014-08-18 MED ORDER — LISINOPRIL 10 MG PO TABS: 10.0000 mg | ORAL_TABLET | Freq: Every day | ORAL | Status: DC

## 2014-09-14 ENCOUNTER — Ambulatory Visit: Payer: BC Managed Care – PPO | Admitting: Neurology

## 2014-09-23 ENCOUNTER — Ambulatory Visit (INDEPENDENT_AMBULATORY_CARE_PROVIDER_SITE_OTHER): Payer: BLUE CROSS/BLUE SHIELD | Admitting: Neurology

## 2014-09-23 ENCOUNTER — Encounter: Payer: Self-pay | Admitting: Neurology

## 2014-09-23 VITALS — BP 110/78 | HR 73 | Resp 16 | Ht 71.0 in | Wt 174.0 lb

## 2014-09-23 DIAGNOSIS — I6309 Cerebral infarction due to thrombosis of other precerebral artery: Secondary | ICD-10-CM | POA: Diagnosis not present

## 2014-09-23 DIAGNOSIS — R200 Anesthesia of skin: Secondary | ICD-10-CM | POA: Diagnosis not present

## 2014-09-23 DIAGNOSIS — I10 Essential (primary) hypertension: Secondary | ICD-10-CM

## 2014-09-23 DIAGNOSIS — E785 Hyperlipidemia, unspecified: Secondary | ICD-10-CM | POA: Insufficient documentation

## 2014-09-23 NOTE — Progress Notes (Signed)
NEUROLOGY FOLLOW UP OFFICE NOTE  Fernando Bell [id number]  HISTORY OF PRESENT ILLNESS: I had the pleasure of seeing Fernando Bell in follow-up in the neurology clinic on 09/23/2014.  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 Stroke workup unremarkable. He reports doing very well with no worsening of symptoms. He continues to have intermittent tingling over the right lateral abdominal area and right big toe and forearm that only last a few minutes. He denies any focal weakness, no vision changes, dysarthria/dysphagia. He has occasional headaches related to stress. He continues to smoke and reports he is not ready to quit. His BP is better, Lisinopril has been added since his last visit.   HPI: This is a pleasant 54 yo RH man with a history of hypertension, hyperlipidemia, tobacco use, in his usual state of health until 01/07/2014 when he felt his right leg/foot go numb and "fall asleep." He kept walking with no weakness noted, and noticed that his right forearm and right lateral abdominal region were also tingling and numb. He had brief tingling over the right cheek that resolved. He continued to work and drove out of state, however due to persistent symptoms, he went to the ER on 01/11/14 where MRI brain showed a small acute infarct in the left thalamus. He underwent stroke workup, CBC and CMP were unremarkable. Lipid panel showed a total cholesterol of 268, LDL 187. HbA1c 5.7. I personally reviewed MRI brain showing the 3mm acute infarct in the left lateral thalamus. MRA brain did not show any significant intracranial stenosis, carotid dopplers showed 1-39% stenosis of bilateral internal carotid arteries. Echocardiogram normal EF, normal left atrium size. He had stopped daily baby aspirin in August, and this was resumed to full dose aspirin during his hospital stay. His Pravachol dose was  increased. Since hospital discharge, he continues to have constant tingling over the right lateral abdominal area. The paresthesias in his right foot and forearm are intermittent and only last a few minutes then resolve. No further facial symptoms. There is a family history of stroke in his older sister who passed away from a pontine stroke at age 6. His father had a heart transplant at age 14.  PAST MEDICAL HISTORY: Past Medical History  Diagnosis Date  . GRAVES' DISEASE 07/28/2009  . HYPERTHYROIDISM 07/27/2009  . HYPERLIPIDEMIA 07/05/2008  . TOBACCO ABUSE 04/23/2008  . HYPERTENSION 04/23/2008  . BACK PAIN 04/23/2008  . Other malaise and fatigue 06/29/2009  . Proteinuria 01/25/2009  . 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 Blood transfusion 1993    after hemorrhoid surgery  . Recurrent kidney stones   . Hypothyroidism 04/15/2014    MEDICATIONS: Current Outpatient Prescriptions on File Prior to Visit  Medication Sig Dispense Refill  . aspirin 325 MG tablet Take 1 tablet (325 mg total) by mouth daily.    . hydrochlorothiazide (HYDRODIURIL) 25 MG tablet Take 1 tablet (25 mg total) by mouth daily. 90 tablet 1  . levothyroxine (SYNTHROID, LEVOTHROID) 112 MCG tablet Take 1 tablet (112 mcg total) by mouth daily before breakfast. 90 tablet 2  . lisinopril (PRINIVIL,ZESTRIL) 10 MG tablet Take 1 tablet (10 mg total) by mouth daily. 90 tablet 1  . pravastatin (PRAVACHOL) 80 MG tablet Take 1 tablet (80 mg total) by mouth daily. 30 tablet 11   No current facility-administered medications on file prior to visit.    ALLERGIES: Allergies  Allergen Reactions  .  Iodine Shortness Of Breath    FAMILY HISTORY: Family History  Problem Relation Age of Onset  . Graves' disease Daughter   . Stroke Sister   . CAD Father     SOCIAL HISTORY: History   Social History  . Marital Status: Married    Spouse Name: N/A  . Number of Children: N/A  . Years of Education: N/A   Occupational History  .  Truck Hospital doctorDriver    Social History Main Topics  . Smoking status: Current Every Day Smoker -- 0.75 packs/day    Types: Cigarettes  . Smokeless tobacco: Former NeurosurgeonUser  . Alcohol Use: 0.0 oz/week    0 Standard drinks or equivalent per week     Comment: once a month  . Drug Use: No  . Sexual Activity: Not on file   Other Topics Concern  . Not on file   Social History Narrative    REVIEW OF SYSTEMS: Constitutional: No fevers, chills, or sweats, no generalized fatigue, change in appetite Eyes: No visual changes, double vision, eye pain Ear, nose and throat: No hearing loss, ear pain, nasal congestion, sore throat Cardiovascular: No chest pain, palpitations Respiratory:  No shortness of breath at rest or with exertion, wheezes GastrointestinaI: No nausea, vomiting, diarrhea, abdominal pain, fecal incontinence Genitourinary:  No dysuria, urinary retention or frequency Musculoskeletal:  + neck pain, back pain Integumentary: No rash, pruritus, skin lesions Neurological: as above Psychiatric: No depression, insomnia, anxiety Endocrine: No palpitations, fatigue, diaphoresis, mood swings, change in appetite, change in weight, increased thirst Hematologic/Lymphatic:  No anemia, purpura, petechiae. Allergic/Immunologic: no itchy/runny eyes, nasal congestion, recent allergic reactions, rashes  PHYSICAL EXAM: Filed Vitals:   09/23/14 0913  BP: 110/78  Pulse: 73  Resp: 16   General: No acute distress Head: Normocephalic/atraumatic Eyes: Fundoscopic exam shows bilateral sharp discs, no vessel changes, exudates, or hemorrhages Neck: supple, no paraspinal tenderness, full range of motion Back: No paraspinal tenderness Heart: regular rate and rhythm Lungs: Clear to auscultation bilaterally. Vascular: No carotid bruits. Skin/Extremities: No rash, no edema Neurological Exam: Mental status: alert and oriented to person, place, and time, no dysarthria or aphasia, Fund of knowledge is  appropriate. Recent and remote memory are intact.3/3 delayed recall. Attention and concentration are normal. Able to name objects and repeat phrases. Cranial nerves: CN I: not tested CN II: pupils equal, round and reactive to light, visual fields intact, fundi unremarkable. CN III, IV, VI: full range of motion, no nystagmus, no ptosis CN V: facial sensation intact CN VII: upper and lower face symmetric CN VIII: hearing intact to finger rub CN IX, X: gag intact, uvula midline CN XI: sternocleidomastoid and trapezius muscles intact CN XII: tongue midline Bulk & Tone: normal, no fasciculations. Motor: 5/5 throughout with no pronator drift. Sensation: intact to light touch, cold, pin. No extinction to double simultaneous stimulation.Romberg test negative Deep Tendon Reflexes: +2 throughout, no ankle clonus Plantar responses: downgoing bilaterally Cerebellar: no incoordination on finger to nose testing Gait: narrow-based and steady, able to tandem walk adequately. Tremor: none  IMPRESSION: This is a pleasant 54 yo RH man with a history of hypertension, hyperlipidemia, tobacco use, with a small left lateral thalamic infarct last September 2015 likely due to small vessel disease. Stroke workup unremarkable except for elevated LDL. No focal weakness, symptoms primarily sensory with tingling over the right side. BP today 110/78. We discussed continued control of vascular risk factors, smoking cessation. He knows to go to the ER if symptoms progress or change. He  will follow-up in 1 year or earlier if needed.   Thank you for allowing me to participate in his care.  Please do not hesitate to call for any questions or concerns.  The duration of this appointment visit was 15 minutes of face-to-face time with the patient.  Greater than 50% of this time was spent in counseling, explanation of diagnosis, planning of further management, and coordination of care.   Patrcia Dolly, M.D.   CC: Dr.  Patsy Lager

## 2014-09-23 NOTE — Patient Instructions (Signed)
1. Continue all your current medications 2. If symptoms worsen or change, go to ER immediately 3. Continue working on smoking cessation 4. Follow-up in 1 year or earlier if needed

## 2014-10-19 ENCOUNTER — Encounter: Payer: Self-pay | Admitting: Family Medicine

## 2014-10-20 ENCOUNTER — Encounter: Payer: Self-pay | Admitting: Family Medicine

## 2014-10-20 MED ORDER — LEVOTHYROXINE SODIUM 112 MCG PO TABS: 112.0000 ug | ORAL_TABLET | Freq: Every day | ORAL | Status: DC

## 2014-11-02 ENCOUNTER — Encounter: Payer: BC Managed Care – PPO | Admitting: Family Medicine

## 2015-02-01 ENCOUNTER — Encounter: Payer: BLUE CROSS/BLUE SHIELD | Admitting: Family Medicine

## 2015-02-15 ENCOUNTER — Encounter: Payer: Self-pay | Admitting: Family Medicine

## 2015-02-15 MED ORDER — LISINOPRIL 10 MG PO TABS: 10.0000 mg | ORAL_TABLET | Freq: Every day | ORAL | Status: DC

## 2015-03-10 ENCOUNTER — Ambulatory Visit (INDEPENDENT_AMBULATORY_CARE_PROVIDER_SITE_OTHER): Payer: BLUE CROSS/BLUE SHIELD | Admitting: Family Medicine

## 2015-03-10 ENCOUNTER — Encounter: Payer: Self-pay | Admitting: Family Medicine

## 2015-03-10 VITALS — BP 120/94 | HR 74 | Temp 98.0°F | Ht 71.0 in | Wt 173.0 lb

## 2015-03-10 DIAGNOSIS — Z125 Encounter for screening for malignant neoplasm of prostate: Secondary | ICD-10-CM

## 2015-03-10 DIAGNOSIS — Z79899 Other long term (current) drug therapy: Secondary | ICD-10-CM

## 2015-03-10 DIAGNOSIS — E785 Hyperlipidemia, unspecified: Secondary | ICD-10-CM

## 2015-03-10 DIAGNOSIS — E890 Postprocedural hypothyroidism: Secondary | ICD-10-CM

## 2015-03-10 DIAGNOSIS — Z114 Encounter for screening for human immunodeficiency virus [HIV]: Secondary | ICD-10-CM

## 2015-03-10 DIAGNOSIS — Z23 Encounter for immunization: Secondary | ICD-10-CM

## 2015-03-10 DIAGNOSIS — Z0000 Encounter for general adult medical examination without abnormal findings: Secondary | ICD-10-CM

## 2015-03-10 DIAGNOSIS — Z1159 Encounter for screening for other viral diseases: Secondary | ICD-10-CM

## 2015-03-10 LAB — BASIC METABOLIC PANEL
BUN: 18 mg/dL (ref 6–23)
CO2: 30 meq/L (ref 19–32)
Calcium: 9.8 mg/dL (ref 8.4–10.5)
Chloride: 104 mEq/L (ref 96–112)
Creatinine, Ser: 1.04 mg/dL (ref 0.40–1.50)
GFR: 95.31 mL/min (ref >60.00)
GLUCOSE: 86 mg/dL (ref 70–99)
POTASSIUM: 4.5 meq/L (ref 3.5–5.1)
SODIUM: 141 meq/L (ref 135–145)

## 2015-03-10 LAB — CBC WITH DIFFERENTIAL/PLATELET
Basophils Absolute: 0 10*3/uL (ref 0.0–0.1)
Basophils Relative: 0.5 % (ref 0.0–3.0)
EOS PCT: 0.7 % (ref 0.0–5.0)
Eosinophils Absolute: 0 10*3/uL (ref 0.0–0.7)
HCT: 47.6 % (ref 39.0–52.0)
Hemoglobin: 15.3 g/dL (ref 13.0–17.0)
Lymphocytes Relative: 38.6 % (ref 12.0–46.0)
Lymphs Abs: 1.9 10*3/uL (ref 0.7–4.0)
MCHC: 32 g/dL (ref 30.0–36.0)
MCV: 85.3 fl (ref 78.0–100.0)
MONOS PCT: 14.7 % — AB (ref 3.0–12.0)
Monocytes Absolute: 0.7 10*3/uL (ref 0.1–1.0)
NEUTROS ABS: 2.2 10*3/uL (ref 1.4–7.7)
NEUTROS PCT: 45.5 % (ref 43.0–77.0)
PLATELETS: 212 10*3/uL (ref 150.0–400.0)
RBC: 5.58 Mil/uL (ref 4.22–5.81)
RDW: 16.1 % — ABNORMAL HIGH (ref 11.5–15.5)
WBC: 4.8 10*3/uL (ref 4.0–10.5)

## 2015-03-10 LAB — HEPATIC FUNCTION PANEL
ALBUMIN: 4.4 g/dL (ref 3.5–5.2)
ALT: 17 U/L (ref 0–53)
AST: 17 U/L (ref 0–37)
Alkaline Phosphatase: 60 U/L (ref 39–117)
Bilirubin, Direct: 0.1 mg/dL (ref 0.0–0.3)
TOTAL PROTEIN: 7.3 g/dL (ref 6.0–8.3)
Total Bilirubin: 0.5 mg/dL (ref 0.2–1.2)

## 2015-03-10 LAB — PSA: PSA: 1.23 ng/mL (ref 0.10–4.00)

## 2015-03-10 LAB — LIPID PANEL
CHOLESTEROL: 202 mg/dL — AB (ref 0–200)
HDL: 74.6 mg/dL (ref >39.00)
LDL Cholesterol: 113 mg/dL — ABNORMAL HIGH (ref 0–99)
NonHDL: 127.14
Total CHOL/HDL Ratio: 3
Triglycerides: 71 mg/dL (ref 0.0–149.0)
VLDL: 14.2 mg/dL (ref 0.0–40.0)

## 2015-03-10 LAB — T3, FREE: T3, Free: 3 pg/mL (ref 2.3–4.2)

## 2015-03-10 LAB — T4, FREE: FREE T4: 0.85 ng/dL (ref 0.60–1.60)

## 2015-03-10 LAB — TSH: TSH: 0.52 u[IU]/mL (ref 0.35–4.50)

## 2015-03-10 NOTE — Progress Notes (Signed)
Dr. Frederico Hamman T. Kathreen Dileo, MD, [facility] [place], [address] Phone: [phone] Fax: [phone]  03/10/2015  Patient: Fernando Bell, MRN: [id number], DOB: Jun 09, 1960, 54 y.o.  Primary Physician:  Owens Loffler, MD   Chief Complaint  Patient presents with  . Annual Exam   Subjective:   Fernando Bell is a 54 y.o. pleasant patient who presents with the following:  Preventative Health Maintenance Visit:  Health Maintenance Summary Reviewed and updated, unless pt declines services.  Tobacco History Reviewed. Alcohol: No concerns, no excessive use Exercise Habits: Some activity, rec at least 30 mins 5 times a week STD concerns: no risk or activity to increase risk Drug Use: None Encouraged self-testicular check  Hep C HIV?  Doing ok Working a lot  ? A little better, R sided symptoms  Still smoking [place] lights, < 1 PPD  132/90  Health Maintenance  Topic Date Due  . Hepatitis C Screening  Apr 09, 1961  . HIV Screening  01/17/1975  . INFLUENZA VACCINE  07/23/2015 (Originally 11/23/2014)  . COLONOSCOPY  09/21/2021  . TETANUS/TDAP  03/09/2025   Immunization History  Administered Date(s) Administered  . Tdap 03/10/2015   Patient Active Problem List   Diagnosis Date Noted  . Cerebral infarction due to thrombosis of other precerebral artery ([place]) 09/23/2014  . Numbness on right side 09/23/2014  . Hyperlipidemia 09/23/2014  . CVA (cerebral infarction) 01/11/2014  . 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 GRAVES' DISEASE 07/28/2009  . OTHER MALAISE AND FATIGUE 06/29/2009  . Hyperlipidemia LDL goal <70 07/05/2008  . TOBACCO ABUSE 04/23/2008  . Essential hypertension 04/23/2008   Past Medical History  Diagnosis Date  . GRAVES' DISEASE 07/28/2009  . HYPERTHYROIDISM 07/27/2009  . HYPERLIPIDEMIA 07/05/2008  . TOBACCO ABUSE 04/23/2008  .  HYPERTENSION 04/23/2008  . BACK PAIN 04/23/2008  . Other malaise and fatigue 06/29/2009  . Proteinuria 01/25/2009  . 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 Blood transfusion 1993    after hemorrhoid surgery  . Recurrent kidney stones   . Hypothyroidism 04/15/2014   Past Surgical History  Procedure Laterality Date  . Relocation of left undescended testicle at age 49    . Hemorrhoid surgery  1993  . Av fistula repair  1993  . Small intestine surgery  1993    remote bowel surgeries >15 years ago   Social History   Social History  . Marital Status: Married    Spouse Name: N/A  . Number of Children: N/A  . Years of Education: N/A   Occupational History  . Truck Geophysicist/field seismologist    Social History Main Topics  . Smoking status: Current Every Day Smoker -- 0.75 packs/day    Types: Cigarettes  . Smokeless tobacco: Former Systems developer  . Alcohol Use: 0.0 oz/week    0 Standard drinks or equivalent per week     Comment: once a month  . Drug Use: No  . Sexual Activity: Not on file   Other Topics Concern  . Not on file   Social History Narrative   Family History  Problem Relation Age of Onset  . Graves' disease Daughter   . Stroke Sister   . CAD Father    Allergies  Allergen Reactions  . Iodine Shortness Of Breath    Medication list has been reviewed and updated.   General: Denies fever, chills, sweats. No significant weight loss. Eyes: Denies blurring,significant itching ENT: Denies earache, sore throat,  and hoarseness. Cardiovascular: Denies chest pains, palpitations, dyspnea on exertion Respiratory: Denies cough, dyspnea at rest,wheeezing Breast: no concerns about lumps GI: Denies nausea, vomiting, diarrhea, constipation, change in bowel habits, abdominal pain, melena, hematochezia GU: Denies penile discharge, ED, urinary flow / outflow problems. No STD concerns. Musculoskeletal: Denies back pain, joint pain Derm: Denies rash, itching Neuro: Denies  paresthesias, frequent  falls, frequent headaches - baseline very mild R sided post-stroke changes Psych: Denies depression, anxiety Endocrine: Denies cold intolerance, heat intolerance, polydipsia Heme: Denies enlarged lymph nodes Allergy: No hayfever  Objective:   BP 120/94 mmHg  Pulse 74  Temp(Src) 98 F (36.7 C) (Oral)  Ht 5' 11"  (1.803 m)  Wt 173 lb (78.472 kg)  BMI 24.14 kg/m2 Ideal Body Weight: Weight in (lb) to have BMI = 25: 178.9  No exam data present  GEN: well developed, well nourished, no acute distress Eyes: conjunctiva and lids normal, PERRLA, EOMI ENT: TM clear, nares clear, oral exam WNL Neck: supple, no lymphadenopathy, no thyromegaly, no JVD Pulm: clear to auscultation and percussion, respiratory effort normal CV: regular rate and rhythm, S1-S2, no murmur, rub or gallop, no bruits, peripheral pulses normal and symmetric, no cyanosis, clubbing, edema or varicosities GI: soft, non-tender; no hepatosplenomegaly, masses; active bowel sounds all quadrants GU: no hernia, testicular mass, penile discharge Lymph: no cervical, axillary or inguinal adenopathy MSK: gait normal, muscle tone and strength WNL, no joint swelling, effusions, discoloration, crepitus  SKIN: clear, good turgor, color WNL, no rashes, lesions, or ulcerations Neuro: normal mental status, normal strength, sensation, and motion Psych: alert; oriented to person, place and time, normally interactive and not anxious or depressed in appearance. All labs reviewed with patient.  Lipids:    Component Value Date/Time   CHOL 189 08/11/2014 0903   CHOL 220* 05/20/2013 0601   TRIG 82.0 08/11/2014 0903   TRIG 169 05/20/2013 0601   HDL 68.00 08/11/2014 0903   HDL 56 05/20/2013 0601   LDLDIRECT 170.1 08/14/2011 1103   VLDL 16.4 08/11/2014 0903   VLDL 34 05/20/2013 0601   CHOLHDL 3 08/11/2014 0903   CBC: CBC Latest Ref Rng 01/12/2014 01/11/2014 10/29/2013  WBC 4.0 - 10.5 K/uL 4.0 4.2 5.2  Hemoglobin 13.0 - 17.0 g/dL 14.8 14.5  14.6  Hematocrit 39.0 - 52.0 % 44.4 43.2 43.8  Platelets 150 - 400 K/uL 188 192 502.7    Basic Metabolic Panel:    Component Value Date/Time   NA 138 08/11/2014 0903   NA 135* 05/19/2013 1843   K 3.7 08/11/2014 0903   K 3.4* 05/19/2013 1843   CL 103 08/11/2014 0903   CL 102 05/19/2013 1843   CO2 28 08/11/2014 0903   CO2 27 05/19/2013 1843   BUN 18 08/11/2014 0903   BUN 15 05/19/2013 1843   CREATININE 1.12 08/11/2014 0903   CREATININE 1.10 05/19/2013 1843   GLUCOSE 110* 08/11/2014 0903   GLUCOSE 147* 05/19/2013 1843   CALCIUM 9.8 08/11/2014 0903   CALCIUM 9.7 05/19/2013 1843   Hepatic Function Latest Ref Rng 04/13/2014 01/12/2014 10/29/2013  Total Protein 6.0 - 8.3 g/dL 7.6 7.0 8.0  Albumin 3.5 - 5.2 g/dL 4.3 3.7 3.9  AST 0 - 37 U/L 21 22 28   ALT 0 - 53 U/L 24 24 29   Alk Phosphatase 39 - 117 U/L 64 64 74  Total Bilirubin 0.2 - 1.2 mg/dL 0.7 0.3 0.4  Bilirubin, Direct 0.0 - 0.3 mg/dL 0.1 - 0.0    Lab Results  Component Value Date  TSH 0.17* 08/11/2014   Lab Results  Component Value Date   PSA 0.74 08/21/2011   PSA 0.72 06/29/2009   PSA 0.69 06/29/2008    Assessment and Plan:   Routine general medical examination at a health care facility  Need for prophylactic vaccination with combined diphtheria-tetanus-pertussis (DTP) vaccine - Plan: Tdap vaccine greater than or equal to 7yo IM  Hypothyroidism, postradioiodine therapy - Plan: TSH, T3, free, T4, free  Screening PSA (prostate specific antigen) - Plan: PSA  Hyperlipidemia - Plan: Lipid panel  Encounter for long-term (current) use of medications - Plan: Basic metabolic panel, CBC with Differential/Platelet, Hepatic function panel  Screening for HIV (human immunodeficiency virus) - Plan: HIV antibody  Need for hepatitis C screening test - Plan: Hepatitis C antibody  Health Maintenance Exam: The patient's preventative maintenance and recommended screening tests for an annual wellness exam were reviewed in full  today. Brought up to date unless services declined.  Counselled on the importance of diet, exercise, and its role in overall health and mortality. The patient's FH and SH was reviewed, including their home life, tobacco status, and drug and alcohol status.  Keep working on smoking Check all labs  Follow-up: No Follow-up on file. Unless noted, follow-up in 1 year for Health Maintenance Exam.  New Prescriptions   No medications on file   Modified Medications   No medications on file   Orders Placed This Encounter  Procedures  . Tdap vaccine greater than or equal to 7yo IM  . Basic metabolic panel  . CBC with Differential/Platelet  . Hepatic function panel  . Lipid panel  . PSA  . TSH  . T3, free  . T4, free  . HIV antibody  . Hepatitis C antibody    Signed,  Diania Co T. Araeya Lamb, MD   Patient's Medications  New Prescriptions   No medications on file  Previous Medications   ASPIRIN 325 MG TABLET    Take 1 tablet (325 mg total) by mouth daily.   HYDROCHLOROTHIAZIDE (HYDRODIURIL) 25 MG TABLET    Take 1 tablet (25 mg total) by mouth daily.   LEVOTHYROXINE (SYNTHROID, LEVOTHROID) 112 MCG TABLET    Take 1 tablet (112 mcg total) by mouth daily before breakfast.   LISINOPRIL (PRINIVIL,ZESTRIL) 10 MG TABLET    Take 1 tablet (10 mg total) by mouth daily.   PRAVASTATIN (PRAVACHOL) 80 MG TABLET    Take 1 tablet (80 mg total) by mouth daily.  Modified Medications   No medications on file  Discontinued Medications   No medications on file

## 2015-03-10 NOTE — Progress Notes (Signed)
Pre visit review using our clinic review tool, if applicable. No additional management support is needed unless otherwise documented below in the visit note. 

## 2015-03-10 NOTE — Addendum Note (Signed)
Addended by: Keaja Reaume C on: 03/10/2015 11:01 AM   Modules accepted: SmartSet  

## 2015-03-11 LAB — HIV ANTIBODY (ROUTINE TESTING W REFLEX): HIV: NONREACTIVE

## 2015-03-11 LAB — HEPATITIS C ANTIBODY: HCV AB: NEGATIVE

## 2015-03-12 ENCOUNTER — Encounter: Payer: Self-pay | Admitting: Family Medicine

## 2015-03-17 ENCOUNTER — Other Ambulatory Visit: Payer: Self-pay | Admitting: Family Medicine

## 2015-03-20 ENCOUNTER — Encounter: Payer: Self-pay | Admitting: Family Medicine

## 2015-03-20 MED ORDER — PRAVASTATIN SODIUM 80 MG PO TABS: 80.0000 mg | ORAL_TABLET | Freq: Every day | ORAL | Status: DC

## 2015-04-24 ENCOUNTER — Other Ambulatory Visit: Payer: Self-pay | Admitting: Family Medicine

## 2015-05-31 ENCOUNTER — Other Ambulatory Visit: Payer: Self-pay | Admitting: Family Medicine

## 2015-05-31 ENCOUNTER — Encounter: Payer: Self-pay | Admitting: Family Medicine

## 2015-06-09 ENCOUNTER — Encounter: Payer: Self-pay | Admitting: Neurology

## 2015-07-21 ENCOUNTER — Other Ambulatory Visit: Payer: Self-pay | Admitting: Family Medicine

## 2015-09-17 ENCOUNTER — Other Ambulatory Visit: Payer: Self-pay | Admitting: Family Medicine

## 2015-09-23 ENCOUNTER — Ambulatory Visit: Payer: BLUE CROSS/BLUE SHIELD | Admitting: Neurology

## 2015-10-08 ENCOUNTER — Encounter: Payer: Self-pay | Admitting: Family Medicine

## 2015-11-02 ENCOUNTER — Telehealth: Payer: Self-pay | Admitting: Family Medicine

## 2015-11-02 IMAGING — CR DG CERVICAL SPINE COMPLETE 4+V
6 series · 6 of 6 positions shown · non-contrast
Comparison: None.

CLINICAL DATA: Right upper extremity numbness and tingling which
began approximately 3 days ago. No recent injuries.

EXAM:
CERVICAL SPINE  4+ VIEWS

[w cervical spine lat]
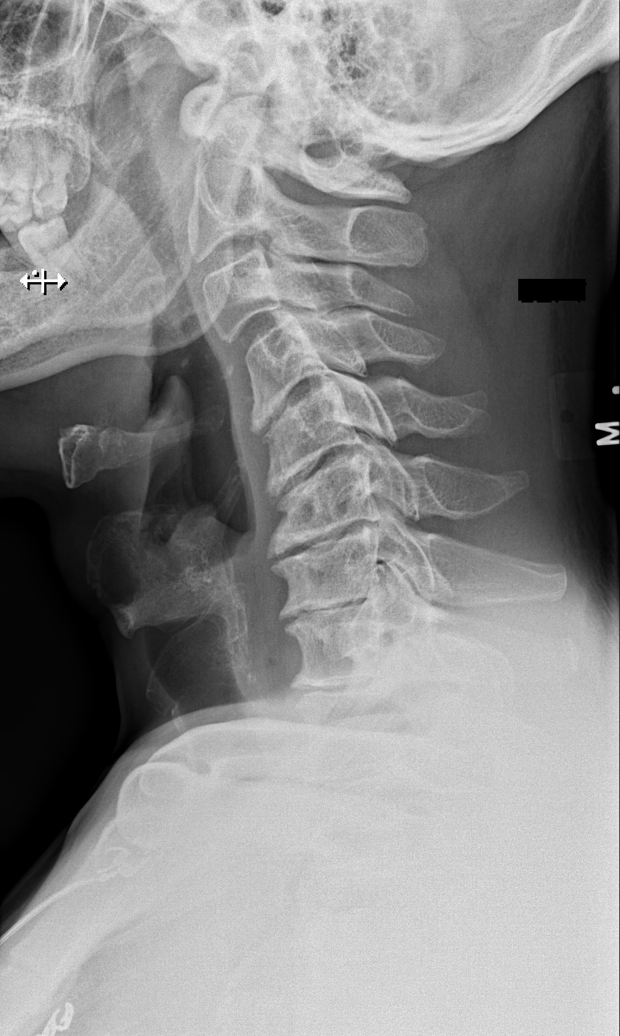

[w cervical spine ap_obl (1 of 2)]
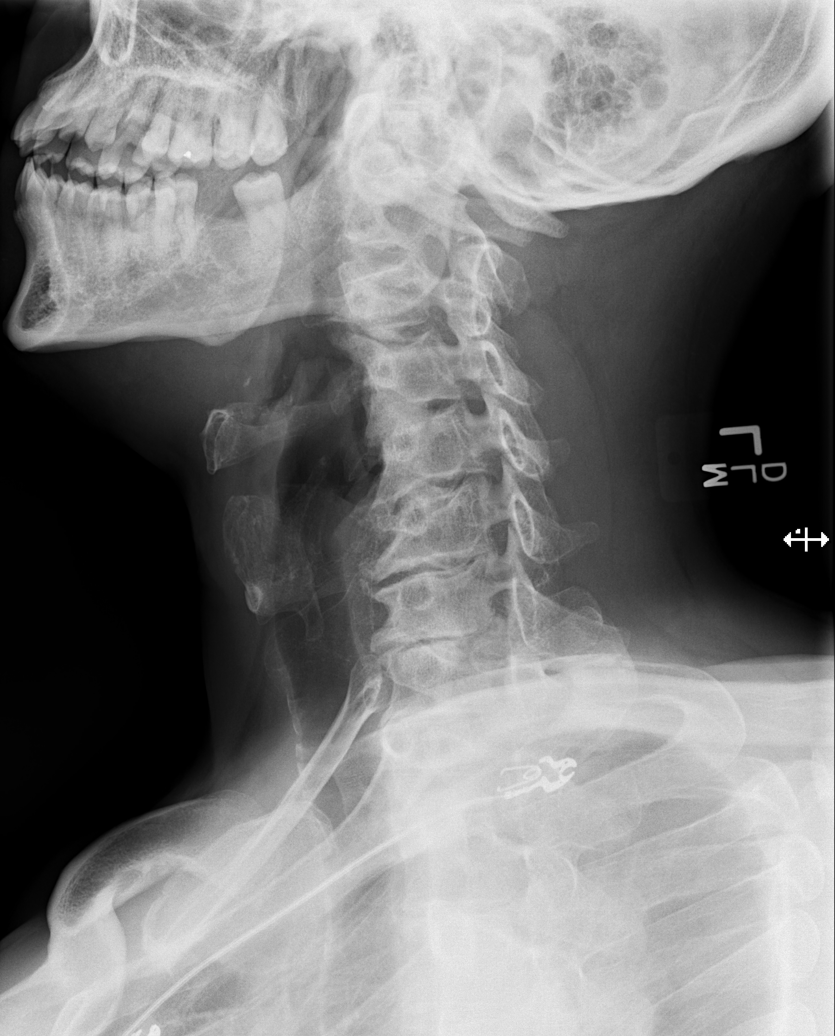

[w cervical spine ap_obl (2 of 2)]
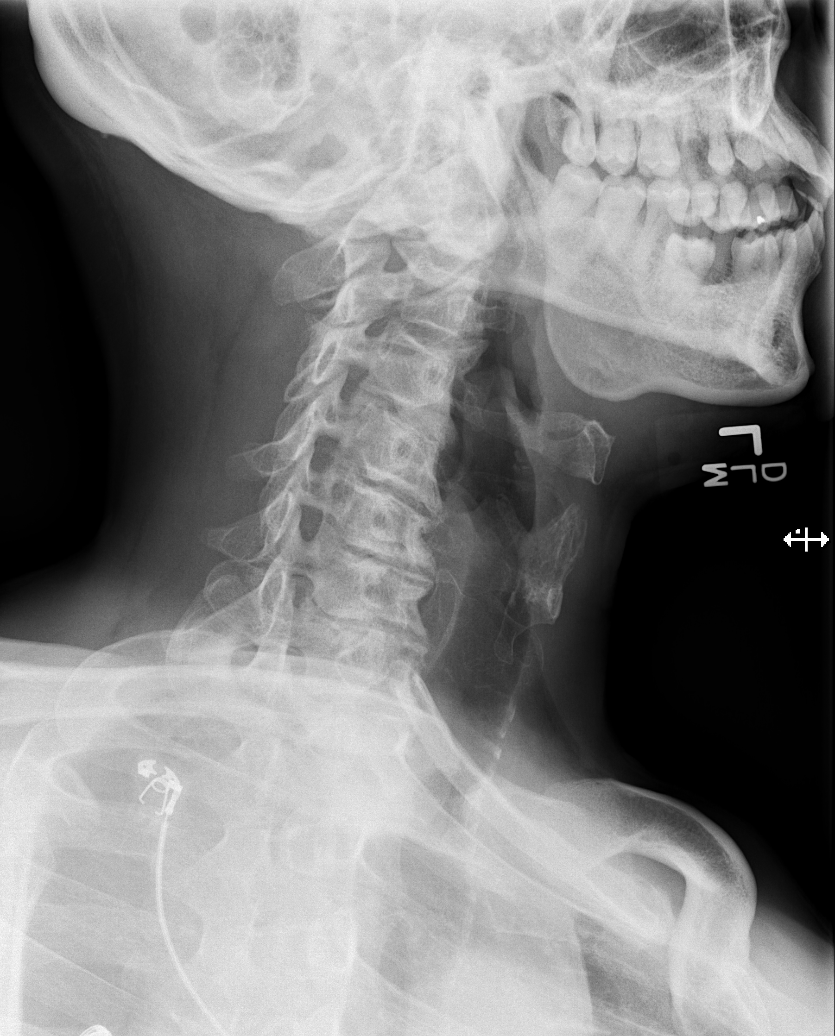

[w cervical spine ap]
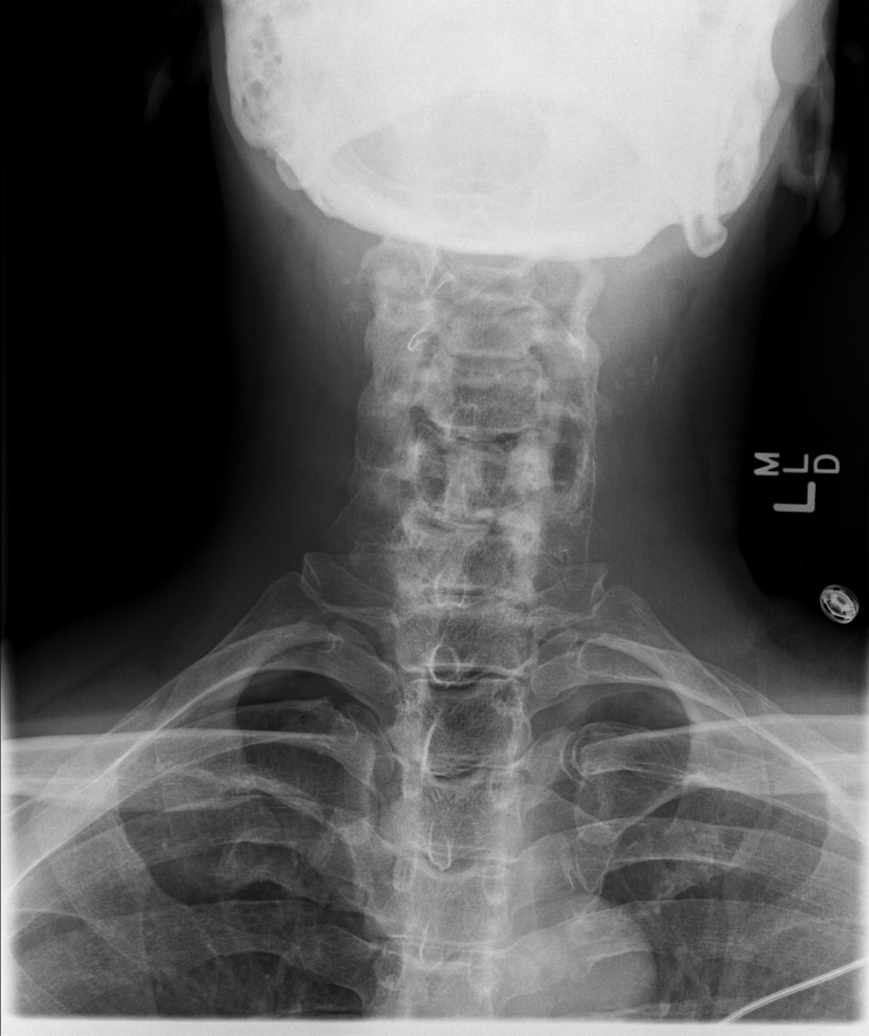

[w cervical spine odontoid (1 of 2)]
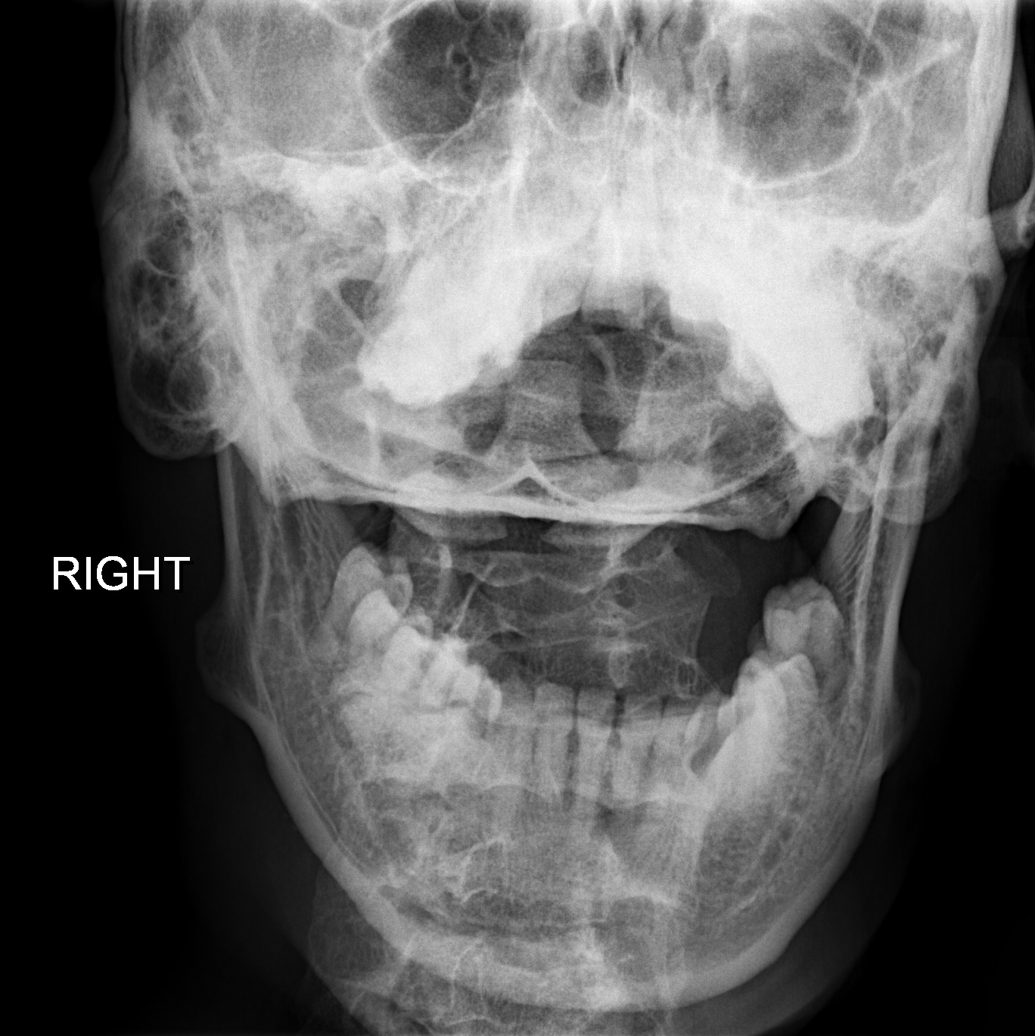

[w cervical spine odontoid (2 of 2)]
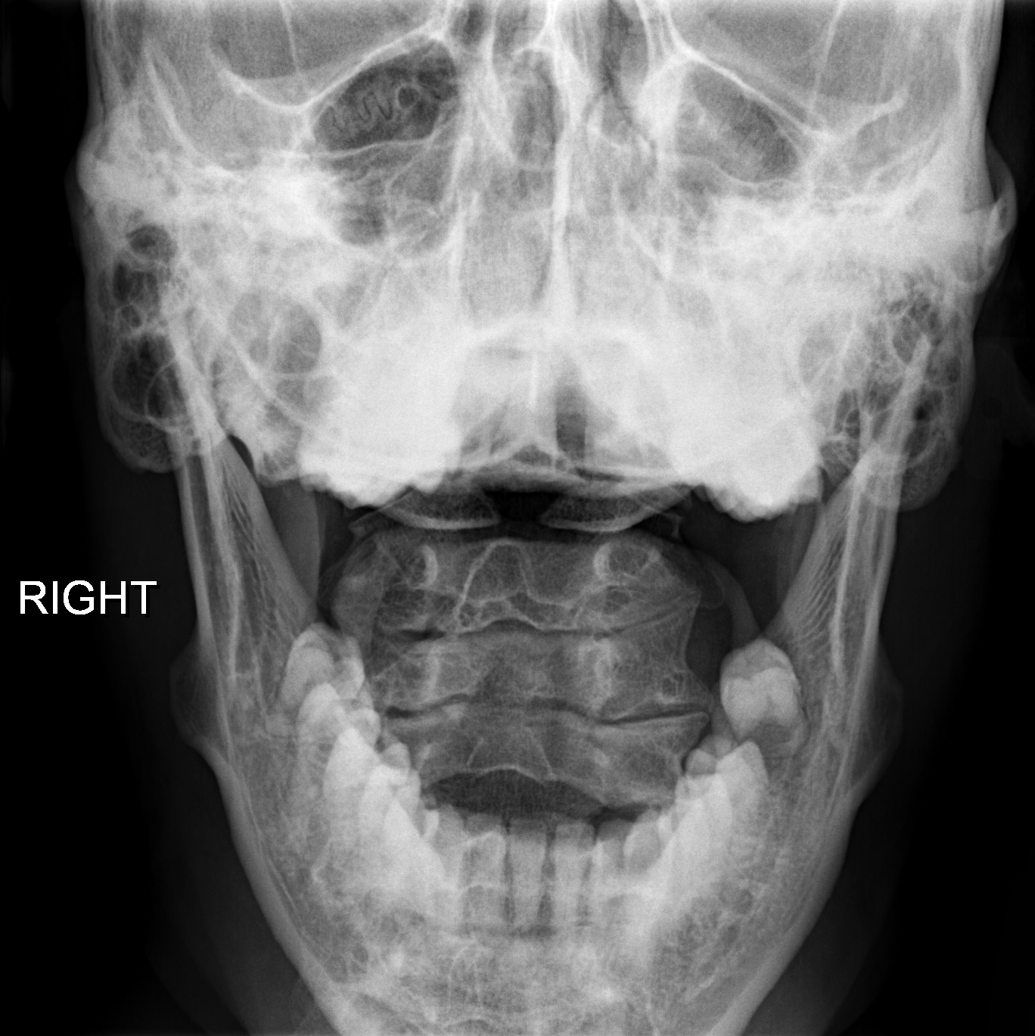

[6 of 6 positions shown; findings below may reference images not displayed]

FINDINGS: Reversal of the usual cervical lordosis centered at C5-6. Anatomic
posterior alignment. No visible fractures. Disc space narrowing and
endplate hypertrophic changes at every level from C4-5 through T1
to, greatest at C7-T1. Facet joints intact. Moderate right foraminal
stenoses at C5-6, C6-7 and C7-T1 and moderate left foraminal
stenosis on the left at C7-T1. No static evidence of instability.
Bilateral carotid atherosclerosis noted.
IMPRESSION: Reversal of the usual lordosis centered at C5-6, likely reflecting
positioning and/or spasm. Multilevel degenerative disc disease and
spondylosis, greatest at C7-T1. Right foraminal stenoses at C5-6,
C6-7 and C7-T1 and left foraminal stenosis at C7-T1.

## 2015-11-02 NOTE — Telephone Encounter (Signed)
Lupita LeashDonna, it looks like he never read this response to his question. Do you mind calling Dorene SorrowJerry to see what is going on?

## 2015-11-02 NOTE — Telephone Encounter (Signed)
Left message for Fernando Bell to return my call.

## 2015-11-02 NOTE — Telephone Encounter (Signed)
I am not sure I understand?   Are you still taking your other blood pressure medicine? If you are having side effects, then we can make a change.   Electronically Signed By: Hannah BeatSpencer Qiana Landgrebe, MD On: 10/11/2015 8:34 AM      Previous Messages     ----- Message -----   From: Les PouHUBBARD,Ruven   Sent: 10/08/2015 2:18 PM EDT    To: Hannah BeatSpencer Krystal Delduca, MD  Subject: Non-Urgent Medical Question   I have stopped talking the lisinopril. It is not working to benefit my quality of life. I request a prescription of Norvasc to replace it.

## 2015-11-03 MED ORDER — AMLODIPINE BESYLATE 5 MG PO TABS: 5.0000 mg | ORAL_TABLET | Freq: Every day | ORAL | Status: DC

## 2015-11-03 NOTE — Addendum Note (Signed)
Addended by: Damita LackLORING, DONNA S on: 11/03/2015 02:27 PM   Modules accepted: Orders, Medications

## 2015-11-03 NOTE — Telephone Encounter (Signed)
Mr. Williams CheHubbard notified as instructed by telephone.  Norvasc Rx sent into [facility] on [address] as instructed by Dr. Patsy Lageropland.

## 2015-11-03 NOTE — Telephone Encounter (Signed)
Stop the lisinopril.  Ok to start norvasc 5 mg, 1 po daily, #30, 5 refills.  May take a week or so for the lisinopril to fully get out of system

## 2015-11-03 NOTE — Telephone Encounter (Signed)
Spoke with Fernando Bell.  He states he has not stopped taking the Lisinopril yet.  He states he has had low libido since starting the lisinopril and it has gotten progressively worse.  He is still taking the HCTZ as well but would like to change the Lisinopril to maybe Norvasc.  Please advise.

## 2016-01-25 ENCOUNTER — Ambulatory Visit: Payer: Self-pay | Admitting: Neurology

## 2016-03-13 ENCOUNTER — Encounter: Payer: Self-pay | Admitting: Family Medicine

## 2016-04-05 ENCOUNTER — Other Ambulatory Visit: Payer: Self-pay | Admitting: Family Medicine

## 2016-04-14 ENCOUNTER — Encounter: Payer: Self-pay | Admitting: Family Medicine

## 2016-04-14 MED ORDER — HYDROCHLOROTHIAZIDE 25 MG PO TABS: 25.0000 mg | ORAL_TABLET | Freq: Every day | ORAL | 0 refills | Status: DC

## 2016-04-26 ENCOUNTER — Encounter: Payer: Self-pay | Admitting: Family Medicine

## 2016-05-01 ENCOUNTER — Ambulatory Visit: Payer: Self-pay | Admitting: Neurology

## 2016-05-17 ENCOUNTER — Encounter: Payer: Self-pay | Admitting: Family Medicine

## 2016-05-17 MED ORDER — LEVOTHYROXINE SODIUM 112 MCG PO TABS: ORAL_TABLET | ORAL | 0 refills | Status: DC

## 2016-05-17 MED ORDER — PRAVASTATIN SODIUM 80 MG PO TABS: 80.0000 mg | ORAL_TABLET | Freq: Every day | ORAL | 0 refills | Status: DC

## 2016-05-17 MED ORDER — HYDROCHLOROTHIAZIDE 25 MG PO TABS: 25.0000 mg | ORAL_TABLET | Freq: Every day | ORAL | 0 refills | Status: DC

## 2016-05-17 MED ORDER — AMLODIPINE BESYLATE 5 MG PO TABS
5.0000 mg | ORAL_TABLET | Freq: Every day | ORAL | 0 refills | Status: DC
Start: 2016-05-17 — End: 2016-06-16

## 2016-05-18 ENCOUNTER — Other Ambulatory Visit: Payer: Self-pay | Admitting: Family Medicine

## 2016-05-31 ENCOUNTER — Ambulatory Visit (INDEPENDENT_AMBULATORY_CARE_PROVIDER_SITE_OTHER): Payer: Self-pay | Admitting: Family Medicine

## 2016-05-31 ENCOUNTER — Encounter: Payer: Self-pay | Admitting: Family Medicine

## 2016-05-31 VITALS — BP 120/76 | HR 83 | Temp 98.1°F | Ht 70.5 in | Wt 175.5 lb

## 2016-05-31 DIAGNOSIS — Z79899 Other long term (current) drug therapy: Secondary | ICD-10-CM

## 2016-05-31 DIAGNOSIS — E890 Postprocedural hypothyroidism: Secondary | ICD-10-CM

## 2016-05-31 DIAGNOSIS — E785 Hyperlipidemia, unspecified: Secondary | ICD-10-CM

## 2016-05-31 DIAGNOSIS — Z125 Encounter for screening for malignant neoplasm of prostate: Secondary | ICD-10-CM

## 2016-05-31 LAB — BASIC METABOLIC PANEL
BUN: 16 mg/dL (ref 6–23)
CALCIUM: 9.9 mg/dL (ref 8.4–10.5)
CO2: 28 meq/L (ref 19–32)
Chloride: 105 mEq/L (ref 96–112)
Creatinine, Ser: 1.02 mg/dL (ref 0.40–1.50)
GFR: 97.03 mL/min (ref >60.00)
GLUCOSE: 94 mg/dL (ref 70–99)
POTASSIUM: 4 meq/L (ref 3.5–5.1)
SODIUM: 139 meq/L (ref 135–145)

## 2016-05-31 LAB — LIPID PANEL
Cholesterol: 231 mg/dL — ABNORMAL HIGH (ref 0–200)
HDL: 77.4 mg/dL (ref >39.00)
LDL CALC: 132 mg/dL — AB (ref 0–99)
NonHDL: 153.35
Total CHOL/HDL Ratio: 3
Triglycerides: 109 mg/dL (ref 0.0–149.0)
VLDL: 21.8 mg/dL (ref 0.0–40.0)

## 2016-05-31 LAB — HEPATIC FUNCTION PANEL
ALK PHOS: 70 U/L (ref 39–117)
ALT: 22 U/L (ref 0–53)
AST: 21 U/L (ref 0–37)
Albumin: 4.6 g/dL (ref 3.5–5.2)
BILIRUBIN TOTAL: 0.5 mg/dL (ref 0.2–1.2)
Bilirubin, Direct: 0.1 mg/dL (ref 0.0–0.3)
Total Protein: 7.9 g/dL (ref 6.0–8.3)

## 2016-05-31 LAB — PSA: PSA: 1.46 ng/mL (ref 0.10–4.00)

## 2016-05-31 LAB — TSH: TSH: 1.4 u[IU]/mL (ref 0.35–4.50)
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
## 2016-05-31 NOTE — Progress Notes (Signed)
Pre visit review using our clinic review tool, if applicable. No additional management support is needed unless otherwise documented below in the visit note. 

## 2016-06-04 ENCOUNTER — Encounter: Payer: Self-pay | Admitting: Neurology

## 2016-06-05 ENCOUNTER — Ambulatory Visit: Payer: Self-pay | Admitting: Neurology

## 2016-06-16 ENCOUNTER — Other Ambulatory Visit: Payer: Self-pay | Admitting: Family Medicine

## 2016-07-18 ENCOUNTER — Encounter: Payer: Self-pay | Admitting: Family Medicine

## 2016-07-19 ENCOUNTER — Encounter: Payer: Self-pay | Admitting: Family Medicine

## 2016-07-19 MED ORDER — VARDENAFIL HCL 20 MG PO TABS: 10.0000 mg | ORAL_TABLET | ORAL | 11 refills | Status: DC | PRN

## 2016-07-21 ENCOUNTER — Encounter: Payer: Self-pay | Admitting: Family Medicine

## 2016-07-23 MED ORDER — SILDENAFIL CITRATE 20 MG PO TABS: ORAL_TABLET | ORAL | 11 refills | Status: DC

## 2016-09-21 ENCOUNTER — Encounter: Payer: Self-pay | Admitting: Family Medicine

## 2017-02-17 ENCOUNTER — Other Ambulatory Visit: Payer: Self-pay | Admitting: Family Medicine

## 2017-04-18 ENCOUNTER — Other Ambulatory Visit: Payer: Self-pay | Admitting: *Deleted

## 2017-04-18 MED ORDER — AMLODIPINE BESYLATE 5 MG PO TABS: 5.0000 mg | ORAL_TABLET | Freq: Every day | ORAL | 0 refills | Status: DC

## 2017-04-25 ENCOUNTER — Encounter: Payer: Self-pay | Admitting: Family Medicine

## 2017-04-25 ENCOUNTER — Other Ambulatory Visit: Payer: Self-pay | Admitting: Family Medicine

## 2017-04-25 MED ORDER — AMLODIPINE BESYLATE 5 MG PO TABS: 5.0000 mg | ORAL_TABLET | Freq: Every day | ORAL | 0 refills | Status: DC

## 2017-05-23 ENCOUNTER — Other Ambulatory Visit: Payer: Self-pay | Admitting: *Deleted

## 2017-05-23 NOTE — Telephone Encounter (Signed)
Received fax for refill on Prav[facility]atin [facility] to [facility] [place]  Left message for Mr. Williams CheHubbard to call us back to confirm if he did request this refill at that [facility].  [facility] from [facility] when he [facility] back.  He also need to schedule his annual physical with Dr. Patsy Lageropland for something in February 2019.

## 2017-05-23 NOTE — Telephone Encounter (Signed)
Pt did not request meds to be filled in [place] he will be calling from [place] tomorrow to order them and his cpe has been scheduled for 06/27/16 @10 :20

## 2017-06-18 ENCOUNTER — Encounter: Payer: Self-pay | Admitting: *Deleted

## 2017-06-18 NOTE — Telephone Encounter (Signed)
This encounter was created in error - please disregard.

## 2017-06-27 ENCOUNTER — Other Ambulatory Visit: Payer: Self-pay

## 2017-06-27 ENCOUNTER — Encounter: Payer: Self-pay | Admitting: Family Medicine

## 2017-06-27 ENCOUNTER — Ambulatory Visit (INDEPENDENT_AMBULATORY_CARE_PROVIDER_SITE_OTHER): Payer: Self-pay | Admitting: Family Medicine

## 2017-06-27 VITALS — BP 120/82 | HR 98 | Temp 98.4°F | Ht 70.5 in | Wt 180.5 lb

## 2017-06-27 DIAGNOSIS — E785 Hyperlipidemia, unspecified: Secondary | ICD-10-CM

## 2017-06-27 DIAGNOSIS — E039 Hypothyroidism, unspecified: Secondary | ICD-10-CM

## 2017-06-27 DIAGNOSIS — Z79899 Other long term (current) drug therapy: Secondary | ICD-10-CM

## 2017-06-27 DIAGNOSIS — Z125 Encounter for screening for malignant neoplasm of prostate: Secondary | ICD-10-CM

## 2017-06-27 LAB — CBC WITH DIFFERENTIAL/PLATELET
BASOS ABS: 0 10*3/uL (ref 0.0–0.1)
BASOS PCT: 0.3 % (ref 0.0–3.0)
EOS ABS: 0 10*3/uL (ref 0.0–0.7)
Eosinophils Relative: 0.3 % (ref 0.0–5.0)
HEMATOCRIT: 47.2 % (ref 39.0–52.0)
HEMOGLOBIN: 15.9 g/dL (ref 13.0–17.0)
LYMPHS PCT: 35.5 % (ref 12.0–46.0)
Lymphs Abs: 1.8 10*3/uL (ref 0.7–4.0)
MCHC: 33.8 g/dL (ref 30.0–36.0)
MCV: 83 fl (ref 78.0–100.0)
MONOS PCT: 12.8 % — AB (ref 3.0–12.0)
Monocytes Absolute: 0.6 10*3/uL (ref 0.1–1.0)
Neutro Abs: 2.6 10*3/uL (ref 1.4–7.7)
Neutrophils Relative %: 51.1 % (ref 43.0–77.0)
Platelets: 202 10*3/uL (ref 150.0–400.0)
RBC: 5.68 Mil/uL (ref 4.22–5.81)
RDW: 14.6 % (ref 11.5–15.5)
WBC: 5.1 10*3/uL (ref 4.0–10.5)

## 2017-06-27 LAB — BASIC METABOLIC PANEL
BUN: 18 mg/dL (ref 6–23)
CALCIUM: 10.2 mg/dL (ref 8.4–10.5)
CHLORIDE: 101 meq/L (ref 96–112)
CO2: 30 mEq/L (ref 19–32)
CREATININE: 1.06 mg/dL (ref 0.40–1.50)
GFR: 92.46 mL/min (ref >60.00)
Glucose, Bld: 91 mg/dL (ref 70–99)
Potassium: 3.9 mEq/L (ref 3.5–5.1)
SODIUM: 140 meq/L (ref 135–145)

## 2017-06-27 LAB — PSA: PSA: 1.59 ng/mL (ref 0.10–4.00)

## 2017-06-27 LAB — LIPID PANEL
CHOLESTEROL: 205 mg/dL — AB (ref 0–200)
HDL: 81.2 mg/dL (ref >39.00)
LDL Cholesterol: 110 mg/dL — ABNORMAL HIGH (ref 0–99)
NONHDL: 124.01
Total CHOL/HDL Ratio: 3
Triglycerides: 69 mg/dL (ref 0.0–149.0)
VLDL: 13.8 mg/dL (ref 0.0–40.0)

## 2017-06-27 LAB — HEPATIC FUNCTION PANEL
ALK PHOS: 74 U/L (ref 39–117)
ALT: 19 U/L (ref 0–53)
AST: 17 U/L (ref 0–37)
Albumin: 4.4 g/dL (ref 3.5–5.2)
Bilirubin, Direct: 0.1 mg/dL (ref 0.0–0.3)
TOTAL PROTEIN: 7.5 g/dL (ref 6.0–8.3)
Total Bilirubin: 0.5 mg/dL (ref 0.2–1.2)

## 2017-06-27 LAB — TSH: TSH: 0.53 u[IU]/mL (ref 0.35–4.50)

## 2017-06-27 MED ORDER — AMLODIPINE BESYLATE 5 MG PO TABS: 5.0000 mg | ORAL_TABLET | Freq: Every day | ORAL | 3 refills | Status: DC

## 2017-06-27 MED ORDER — HYDROCHLOROTHIAZIDE 25 MG PO TABS: 25.0000 mg | ORAL_TABLET | Freq: Every day | ORAL | 3 refills | Status: DC

## 2017-06-27 MED ORDER — LEVOTHYROXINE SODIUM 112 MCG PO TABS: ORAL_TABLET | ORAL | 3 refills | Status: DC

## 2017-06-27 MED ORDER — PRAVASTATIN SODIUM 80 MG PO TABS: 80.0000 mg | ORAL_TABLET | Freq: Every day | ORAL | 3 refills | Status: DC

## 2017-06-27 NOTE — Progress Notes (Signed)
Dr. Karleen HampshireSpencer T. Salik Grewell, MD, CAQ Sports Medicine Primary Care and Sports Medicine [address] [place], [id number] Phone: [phone] Fax: [phone]  06/27/2017  Patient: Fernando Bell, MRN: [id number], DOB: 09-27-1960, 57 y.o.  Primary Physician:  Hannah Beatopland, Gagan Dillion, MD   Subjective:   Fernando Bell is a 57 y.o. very pleasant male patient who presents with the following:  H/o cva in the past  Good bp control  Check chol  Taking all meds  Cont to smoke  GEN: No acute illnesses, no fevers, chills. GI: No n/v/d, eating normally Pulm: No SOB Interactive and getting along well at home.  Otherwise, ROS is as per the HPI.  Objective:   BP 120/82   Pulse 98   Temp 98.4 F (36.9 C) (Oral)   Ht 5' 10.5" (1.791 m)   Wt 180 lb 8 oz (81.9 kg)   BMI 25.53 kg/m    GEN: WDWN, NAD, Non-toxic, Alert & Oriented x 3 HEENT: Atraumatic, Normocephalic.  Ears and Nose: No external deformity. EXTR: No clubbing/cyanosis/edema ABD: S, NT, ND, + BS, No rebound, No HSM  NEURO: Normal gait.  PSYCH: Normally interactive. Conversant. Not depressed or anxious appearing.  Calm demeanor.   Laboratory and Imaging Data:  Assessment and Plan:   Hypothyroidism, unspecified type - Plan: TSH  Screening PSA (prostate specific antigen) - Plan: PSA  Hyperlipidemia LDL goal <70 - Plan: Lipid panel  Encounter for long-term current use of medication - Plan: Basic metabolic panel, CBC with Differential/Platelet, Hepatic function panel  Check labs  Stop smoking  Follow-up: No Follow-up on file.  Meds ordered this encounter  Medications  . pravastatin (PRAVACHOL) 80 MG tablet    Sig: Take 1 tablet (80 mg total) by mouth daily.    Dispense:  90 tablet    Refill:  3  . levothyroxine (SYNTHROID, LEVOTHROID) 112 MCG tablet    Sig: TAKE 1 TABLET BY MOUTH EVERY DAY BEFORE BREAKFAST    Dispense:  90 tablet    Refill:  3  . hydrochlorothiazide (HYDRODIURIL) 25 MG tablet    Sig: Take 1  tablet (25 mg total) by mouth daily. OFFICE VISIT REQUIRED FOR ADDITIONAL REFILLS    Dispense:  90 tablet    Refill:  3  . amLODipine (NORVASC) 5 MG tablet    Sig: Take 1 tablet (5 mg total) by mouth daily.    Dispense:  90 tablet    Refill:  3   Medications Discontinued During This Encounter  Medication Reason  . pravastatin (PRAVACHOL) 80 MG tablet Reorder  . levothyroxine (SYNTHROID, LEVOTHROID) 112 MCG tablet Reorder  . hydrochlorothiazide (HYDRODIURIL) 25 MG tablet Reorder  . amLODipine (NORVASC) 5 MG tablet Reorder   Orders Placed This Encounter  Procedures  . Basic metabolic panel  . CBC with Differential/Platelet  . Hepatic function panel  . Lipid panel  . PSA  . TSH    Signed,  Karleen HampshireSpencer T. Piedad Standiford, MD   Allergies as of 06/27/2017      Reactions   Iodine Shortness Of Breath      Medication List        Accurate as of 06/27/17  1:21 PM. Always use your most recent med list.          amLODipine 5 MG tablet Commonly known as:  NORVASC Take 1 tablet (5 mg total) by mouth daily.   aspirin 325 MG tablet Take 1 tablet (325 mg total) by mouth daily.   hydrochlorothiazide 25 MG  tablet Commonly known as:  HYDRODIURIL Take 1 tablet (25 mg total) by mouth daily. OFFICE VISIT REQUIRED FOR ADDITIONAL REFILLS   levothyroxine 112 MCG tablet Commonly known as:  SYNTHROID, LEVOTHROID TAKE 1 TABLET BY MOUTH EVERY DAY BEFORE BREAKFAST   pravastatin 80 MG tablet Commonly known as:  PRAVACHOL Take 1 tablet (80 mg total) by mouth daily.   sildenafil 20 MG tablet Commonly known as:  REVATIO Generic Revatio / Sildanefil 20 mg. 2 - 5 tabs 30 mins prior to intercourse.

## 2017-10-02 ENCOUNTER — Encounter: Payer: Self-pay | Admitting: Family Medicine

## 2017-10-03 ENCOUNTER — Encounter: Payer: Self-pay | Admitting: Family Medicine

## 2017-10-03 MED ORDER — SILDENAFIL CITRATE 20 MG PO TABS: ORAL_TABLET | ORAL | 11 refills | Status: DC

## 2017-10-22 ENCOUNTER — Other Ambulatory Visit: Payer: Self-pay | Admitting: Family Medicine

## 2018-07-02 ENCOUNTER — Telehealth: Payer: Self-pay

## 2018-07-02 NOTE — Telephone Encounter (Signed)
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 Please advise.

## 2018-07-03 MED ORDER — AMLODIPINE BESYLATE 5 MG PO TABS: 5.0000 mg | ORAL_TABLET | Freq: Every day | ORAL | 0 refills | Status: DC

## 2018-07-03 MED ORDER — LEVOTHYROXINE SODIUM 112 MCG PO TABS: ORAL_TABLET | ORAL | 0 refills | Status: DC

## 2018-07-03 NOTE — Telephone Encounter (Signed)
Refills sent as requested

## 2018-07-15 ENCOUNTER — Other Ambulatory Visit: Payer: Self-pay | Admitting: Family Medicine

## 2018-07-24 ENCOUNTER — Other Ambulatory Visit: Payer: Self-pay

## 2018-07-29 ENCOUNTER — Encounter: Payer: Self-pay | Admitting: Family Medicine

## 2018-08-03 ENCOUNTER — Other Ambulatory Visit: Payer: Self-pay | Admitting: Family Medicine

## 2018-08-05 ENCOUNTER — Other Ambulatory Visit: Payer: Self-pay | Admitting: Family Medicine

## 2018-08-05 ENCOUNTER — Encounter: Payer: Self-pay | Admitting: Family Medicine

## 2018-08-05 ENCOUNTER — Telehealth: Payer: Self-pay | Admitting: Family Medicine

## 2018-08-05 MED ORDER — AMLODIPINE BESYLATE 5 MG PO TABS: 5.0000 mg | ORAL_TABLET | Freq: Every day | ORAL | 0 refills | Status: DC

## 2018-08-05 MED ORDER — LEVOTHYROXINE SODIUM 112 MCG PO TABS: ORAL_TABLET | ORAL | 0 refills | Status: DC

## 2018-08-05 MED ORDER — PRAVASTATIN SODIUM 80 MG PO TABS: 80.0000 mg | ORAL_TABLET | Freq: Every day | ORAL | 0 refills | Status: DC

## 2018-08-05 NOTE — Telephone Encounter (Signed)
[facility] [place] [facility] [address] as requested.

## 2018-08-05 NOTE — Telephone Encounter (Signed)
Patient is requesting a refill on his Pravastatin.  Patient took his last pill yesterday.  Patient would like rx sent to [facility].

## 2018-10-11 ENCOUNTER — Other Ambulatory Visit: Payer: Self-pay | Admitting: Family Medicine

## 2018-10-11 DIAGNOSIS — I10 Essential (primary) hypertension: Secondary | ICD-10-CM

## 2018-10-11 DIAGNOSIS — E785 Hyperlipidemia, unspecified: Secondary | ICD-10-CM

## 2018-10-11 DIAGNOSIS — Z79899 Other long term (current) drug therapy: Secondary | ICD-10-CM

## 2018-10-11 DIAGNOSIS — E890 Postprocedural hypothyroidism: Secondary | ICD-10-CM

## 2018-10-14 ENCOUNTER — Other Ambulatory Visit (INDEPENDENT_AMBULATORY_CARE_PROVIDER_SITE_OTHER): Payer: Self-pay

## 2018-10-14 DIAGNOSIS — Z79899 Other long term (current) drug therapy: Secondary | ICD-10-CM

## 2018-10-14 DIAGNOSIS — E890 Postprocedural hypothyroidism: Secondary | ICD-10-CM

## 2018-10-14 DIAGNOSIS — I10 Essential (primary) hypertension: Secondary | ICD-10-CM

## 2018-10-14 DIAGNOSIS — E785 Hyperlipidemia, unspecified: Secondary | ICD-10-CM

## 2018-10-14 LAB — BASIC METABOLIC PANEL
BUN: 15 mg/dL (ref 6–23)
CO2: 24 mEq/L (ref 19–32)
Calcium: 9.3 mg/dL (ref 8.4–10.5)
Chloride: 104 mEq/L (ref 96–112)
Creatinine, Ser: 0.99 mg/dL (ref 0.40–1.50)
GFR: 93.71 mL/min (ref >60.00)
Glucose, Bld: 78 mg/dL (ref 70–99)
Potassium: 3.8 mEq/L (ref 3.5–5.1)
Sodium: 139 mEq/L (ref 135–145)

## 2018-10-14 LAB — HEPATIC FUNCTION PANEL
ALT: 19 U/L (ref 0–53)
AST: 24 U/L (ref 0–37)
Albumin: 4.4 g/dL (ref 3.5–5.2)
Alkaline Phosphatase: 61 U/L (ref 39–117)
Bilirubin, Direct: 0.1 mg/dL (ref 0.0–0.3)
Total Bilirubin: 0.4 mg/dL (ref 0.2–1.2)
Total Protein: 7 g/dL (ref 6.0–8.3)

## 2018-10-14 LAB — CBC WITH DIFFERENTIAL/PLATELET
Basophils Absolute: 0 10*3/uL (ref 0.0–0.1)
Basophils Relative: 0.8 % (ref 0.0–3.0)
Eosinophils Absolute: 0 10*3/uL (ref 0.0–0.7)
Eosinophils Relative: 0.5 % (ref 0.0–5.0)
HCT: 44.4 % (ref 39.0–52.0)
Hemoglobin: 14.7 g/dL (ref 13.0–17.0)
Lymphocytes Relative: 31.4 % (ref 12.0–46.0)
Lymphs Abs: 1.5 10*3/uL (ref 0.7–4.0)
MCHC: 33.1 g/dL (ref 30.0–36.0)
MCV: 86.9 fl (ref 78.0–100.0)
Monocytes Absolute: 0.6 10*3/uL (ref 0.1–1.0)
Monocytes Relative: 11.9 % (ref 3.0–12.0)
Neutro Abs: 2.7 10*3/uL (ref 1.4–7.7)
Neutrophils Relative %: 55.4 % (ref 43.0–77.0)
Platelets: 213 10*3/uL (ref 150.0–400.0)
RBC: 5.1 Mil/uL (ref 4.22–5.81)
RDW: 15.9 % — ABNORMAL HIGH (ref 11.5–15.5)
WBC: 4.9 10*3/uL (ref 4.0–10.5)

## 2018-10-14 LAB — PSA: PSA: 1.35 ng/mL (ref 0.10–4.00)

## 2018-10-14 LAB — LIPID PANEL
Cholesterol: 223 mg/dL — ABNORMAL HIGH (ref 0–200)
HDL: 66.6 mg/dL (ref >39.00)
LDL Cholesterol: 128 mg/dL — ABNORMAL HIGH (ref 0–99)
NonHDL: 156.69
Total CHOL/HDL Ratio: 3
Triglycerides: 141 mg/dL (ref 0.0–149.0)
VLDL: 28.2 mg/dL (ref 0.0–40.0)

## 2018-10-14 LAB — TSH: TSH: 0.82 u[IU]/mL (ref 0.35–4.50)

## 2018-10-16 ENCOUNTER — Ambulatory Visit: Payer: Self-pay | Admitting: Family Medicine

## 2018-10-16 ENCOUNTER — Encounter: Payer: Self-pay | Admitting: Family Medicine

## 2018-10-16 ENCOUNTER — Other Ambulatory Visit: Payer: Self-pay

## 2018-10-16 VITALS — BP 120/86 | HR 88 | Temp 98.4°F | Ht 70.75 in | Wt 174.5 lb

## 2018-10-16 DIAGNOSIS — Z0000 Encounter for general adult medical examination without abnormal findings: Secondary | ICD-10-CM

## 2018-10-16 MED ORDER — AMLODIPINE BESYLATE 5 MG PO TABS: 5.0000 mg | ORAL_TABLET | Freq: Every day | ORAL | 3 refills | Status: DC

## 2018-10-16 MED ORDER — ROSUVASTATIN CALCIUM 40 MG PO TABS: 40.0000 mg | ORAL_TABLET | Freq: Every day | ORAL | 3 refills | Status: DC

## 2018-10-16 MED ORDER — LEVOTHYROXINE SODIUM 112 MCG PO TABS: ORAL_TABLET | ORAL | 3 refills | Status: DC

## 2018-10-16 MED ORDER — SILDENAFIL CITRATE 20 MG PO TABS: ORAL_TABLET | ORAL | 11 refills | Status: DC

## 2018-10-16 MED ORDER — HYDROCHLOROTHIAZIDE 25 MG PO TABS: 25.0000 mg | ORAL_TABLET | Freq: Every day | ORAL | 3 refills | Status: DC

## 2018-10-16 NOTE — Progress Notes (Signed)
Skyy Nilan T. Siera Beyersdorf, MD Primary Care and [place] at [facility] [place], [address] Phone: [phone]  FAX: [phone]  Fernando Bell - 58 y.o. male  MRN [id number]  Date of Birth: 01-09-1961  Visit Date: 10/16/2018  PCP: Owens Loffler, MD  Referred by: Owens Loffler, MD  Chief Complaint  Patient presents with  . Annual Exam   Patient Care Team: Owens Loffler, MD as PCP - General Subjective:   Fernando Bell is a 58 y.o. pleasant patient who presents with the following:  Preventative Health Maintenance Visit:  Health Maintenance Summary Reviewed and updated, unless pt declines services.  Tobacco History Reviewed. + Alcohol: No concerns, no excessive use Exercise Habits: Some activity, rec at least 30 mins 5 times a week STD concerns: no risk or activity to increase risk Drug Use: None Encouraged self-testicular check  The 10-year ASCVD risk score Mikey Bussing DC Brooke Bonito., et al., 2013) is: 17.4%   Values used to calculate the score:     Age: 58 years     Sex: Male     Is Non-Hispanic African American: Yes     Diabetic: No     Tobacco smoker: Yes     Systolic Blood Pressure: 229 mmHg     Is BP treated: Yes     HDL Cholesterol: 66.6 mg/dL     Total Cholesterol: 223 mg/dL     Health Maintenance  Topic Date Due  . INFLUENZA VACCINE  11/23/2018  . COLONOSCOPY  09/21/2021  . TETANUS/TDAP  03/09/2025  . Hepatitis C Screening  Completed  . HIV Screening  Completed   Immunization History  Administered Date(s) Administered  . Tdap 03/10/2015   Patient Active Problem List   Diagnosis Date Noted  . 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 GRAVES' DISEASE 07/28/2009  . Hyperlipidemia LDL goal <70 07/05/2008  . TOBACCO ABUSE 04/23/2008  . Essential hypertension  04/23/2008   Past Medical History:  Diagnosis Date  . BACK PAIN 04/23/2008  . Blood transfusion 1993   after hemorrhoid surgery  . 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 GRAVES' DISEASE 07/28/2009  . HYPERLIPIDEMIA 07/05/2008  . HYPERTENSION 04/23/2008  . HYPERTHYROIDISM 07/27/2009  . Hypothyroidism 04/15/2014  . Other malaise and fatigue 06/29/2009  . Proteinuria 01/25/2009  . Recurrent kidney stones   . TOBACCO ABUSE 04/23/2008   Past Surgical History:  Procedure Laterality Date  . AV FISTULA REPAIR  1993  . [place]  . relocation of left undescended testicle at age 61    . SMALL INTESTINE SURGERY  1993   remote bowel surgeries >15 years ago   Social History   Socioeconomic History  . Marital status: Married    Spouse name: Not on file  . Number of children: Not on file  . Years of education: Not on file  . Highest education level: Not on file  Occupational History  . Occupation: Truck Education administrator: [employer]  Social Needs  . Financial resource strain: Not on file  . Food insecurity    Worry: Not on file    Inability: Not on file  . Transportation needs    Medical: Not on file    Non-medical: Not on file  Tobacco Use  . Smoking status: Current Every Day Smoker    Packs/day: 0.75  Types: Cigarettes  . Smokeless tobacco: Former Engineer, waterUser  Substance and Sexual Activity  . Alcohol use: Yes    Alcohol/week: 0.0 standard drinks    Comment: once a month  . Drug use: No  . Sexual activity: Not on file  Lifestyle  . Physical activity    Days per week: Not on file    Minutes per session: Not on file  . Stress: Not on file  Relationships  . Social Musicianconnections    Talks on phone: Not on file    Gets together: Not on file    Attends religious service: Not on file    Active member of club or organization: Not on file    Attends meetings of clubs or organizations: Not on file    Relationship status: Not on file  . Intimate partner violence     Fear of current or ex partner: Not on file    Emotionally abused: Not on file    Physically abused: Not on file    Forced sexual activity: Not on file  Other Topics Concern  . Not on file  Social History Narrative  . Not on file   Family History  Problem Relation Age of Onset  . Graves' disease Daughter   . Stroke Sister   . CAD Father    Allergies  Allergen Reactions  . Iodine Shortness Of Breath    Medication list has been reviewed and updated.   General: Denies fever, chills, sweats. No significant weight loss. Eyes: Denies blurring,significant itching ENT: Denies earache, sore throat, and hoarseness. Cardiovascular: Denies chest pains, palpitations, dyspnea on exertion Respiratory: Denies cough, dyspnea at rest,wheeezing Breast: no concerns about lumps GI: Denies nausea, vomiting, diarrhea, constipation, change in bowel habits, abdominal pain, melena, hematochezia GU: Denies penile discharge, ED, urinary flow / outflow problems. No STD concerns. Musculoskeletal: Denies back pain, joint pain Derm: Denies rash, itching Neuro: Denies  paresthesias, frequent falls, frequent headaches Psych: Denies depression, anxiety Endocrine: Denies cold intolerance, heat intolerance, polydipsia Heme: Denies enlarged lymph nodes Allergy: No hayfever  Objective:   BP 120/86   Pulse 88   Temp 98.4 F (36.9 C) (Oral)   Ht 5' 10.75" (1.797 m)   Wt 174 lb 8 oz (79.2 kg)   BMI 24.51 kg/m  Ideal Body Weight: Weight in (lb) to have BMI = 25: 177.6  Ideal Body Weight: Weight in (lb) to have BMI = 25: 177.6 No exam data present Depression screen [facility] 2/9 10/16/2018 06/27/2017  Decreased Interest 0 0  Down, Depressed, Hopeless 0 0  PHQ - 2 Score 0 0     GEN: well developed, well nourished, no acute distress Eyes: conjunctiva and lids normal, PERRLA, EOMI ENT: TM clear, nares clear, oral exam WNL Neck: supple, no lymphadenopathy, no thyromegaly, no JVD Pulm: clear to auscultation  and percussion, respiratory effort normal CV: regular rate and rhythm, S1-S2, no murmur, rub or gallop, no bruits, peripheral pulses normal and symmetric, no cyanosis, clubbing, edema or varicosities GI: soft, non-tender; no hepatosplenomegaly, masses; active bowel sounds all quadrants GU: no hernia, testicular mass, penile discharge Lymph: no cervical, axillary or inguinal adenopathy MSK: gait normal, muscle tone and strength WNL, no joint swelling, effusions, discoloration, crepitus  SKIN: clear, good turgor, color WNL, no rashes, lesions, or ulcerations Neuro: normal mental status, normal strength, sensation, and motion Psych: alert; oriented to person, place and time, normally interactive and not anxious or depressed in appearance.  All labs reviewed with patient. Results for orders  placed or performed in visit on 10/14/18  TSH  Result Value Ref Range   TSH 0.82 0.35 - 4.50 uIU/mL  PSA  Result Value Ref Range   PSA 1.35 0.10 - 4.00 ng/mL  CBC with Differential/Platelet  Result Value Ref Range   WBC 4.9 4.0 - 10.5 K/uL   RBC 5.10 4.22 - 5.81 Mil/uL   Hemoglobin 14.7 13.0 - 17.0 g/dL   HCT 60.444.4 54.039.0 - 98.152.0 %   MCV 86.9 78.0 - 100.0 fl   MCHC 33.1 30.0 - 36.0 g/dL   RDW 19.115.9 (H) 47.811.5 - 29.515.5 %   Platelets 213.0 150.0 - 400.0 K/uL   Neutrophils Relative % 55.4 43.0 - 77.0 %   Lymphocytes Relative 31.4 12.0 - 46.0 %   Monocytes Relative 11.9 3.0 - 12.0 %   Eosinophils Relative 0.5 0.0 - 5.0 %   Basophils Relative 0.8 0.0 - 3.0 %   Neutro Abs 2.7 1.4 - 7.7 K/uL   Lymphs Abs 1.5 0.7 - 4.0 K/uL   Monocytes Absolute 0.6 0.1 - 1.0 K/uL   Eosinophils Absolute 0.0 0.0 - 0.7 K/uL   Basophils Absolute 0.0 0.0 - 0.1 K/uL  Basic metabolic panel  Result Value Ref Range   Sodium 139 135 - 145 mEq/L   Potassium 3.8 3.5 - 5.1 mEq/L   Chloride 104 96 - 112 mEq/L   CO2 24 19 - 32 mEq/L   Glucose, Bld 78 70 - 99 mg/dL   BUN 15 6 - 23 mg/dL   Creatinine, Ser 6.210.99 0.40 - 1.50 mg/dL   Calcium  9.3 8.4 - 30.810.5 mg/dL   GFR 65.7893.71 >46.96>60.00 mL/min  Hepatic function panel  Result Value Ref Range   Total Bilirubin 0.4 0.2 - 1.2 mg/dL   Bilirubin, Direct 0.1 0.0 - 0.3 mg/dL   Alkaline Phosphatase 61 39 - 117 U/L   AST 24 0 - 37 U/L   ALT 19 0 - 53 U/L   Total Protein 7.0 6.0 - 8.3 g/dL   Albumin 4.4 3.5 - 5.2 g/dL  Lipid panel  Result Value Ref Range   Cholesterol 223 (H) 0 - 200 mg/dL   Triglycerides 295.2141.0 0.0 - 149.0 mg/dL   HDL 84.1366.60 >24.40>39.00 mg/dL   VLDL 10.228.2 0.0 - 72.540.0 mg/dL   LDL Cholesterol 366128 (H) 0 - 99 mg/dL   Total CHOL/HDL Ratio 3    NonHDL 156.69     Assessment and Plan:     ICD-10-CM   1. Healthcare maintenance  Z00.00    Stop smoking D/c pravachol and start crestor  The 10-year ASCVD risk score Denman George(Goff DC Jr., et al., 2013) is: 17.4%   Values used to calculate the score:     Age: 89 years     Sex: Male     Is Non-Hispanic African American: Yes     Diabetic: No     Tobacco smoker: Yes     Systolic Blood Pressure: 120 mmHg     Is BP treated: Yes     HDL Cholesterol: 66.6 mg/dL     Total Cholesterol: 223 mg/dL    Health Maintenance Exam: The patient's preventative maintenance and recommended screening tests for an annual wellness exam were reviewed in full today. Brought up to date unless services declined.  Counselled on the importance of diet, exercise, and its role in overall health and mortality. The patient's FH and SH was reviewed, including their home life, tobacco status, and drug and alcohol status.  Follow-up in  1 year for physical exam or additional follow-up below.  Follow-up: No follow-ups on file. Or follow-up in 1 year if not noted.  No orders of the defined types were placed in this encounter.  There are no discontinued medications. No orders of the defined types were placed in this encounter.   Signed,  Elpidio GaleaSpencer T. Ether Wolters, MD   Allergies as of 10/16/2018      Reactions   Iodine Shortness Of Breath      Medication List        Accurate as of October 16, 2018  9:44 AM. If you have any questions, ask your nurse or doctor.        amLODipine 5 MG tablet Commonly known as: NORVASC Take 1 tablet (5 mg total) by mouth daily.   aspirin 325 MG tablet Take 1 tablet (325 mg total) by mouth daily.   hydrochlorothiazide 25 MG tablet Commonly known as: HYDRODIURIL TAKE 1 TABLET (25 MG TOTAL) BY MOUTH DAILY. OFFICE VISIT REQUIRED FOR ADDITIONAL REFILLS   levothyroxine 112 MCG tablet Commonly known as: SYNTHROID TAKE 1 TABLET BY MOUTH EVERY DAY BEFORE BREAKFAST   pravastatin 80 MG tablet Commonly known as: PRAVACHOL Take 1 tablet (80 mg total) by mouth daily.   sildenafil 20 MG tablet Commonly known as: Revatio Generic Revatio / Sildanefil 20 mg. 2 - 5 tabs 30 mins prior to intercourse.

## 2018-10-17 ENCOUNTER — Other Ambulatory Visit: Payer: Self-pay | Admitting: Family Medicine

## 2019-10-28 ENCOUNTER — Other Ambulatory Visit: Payer: Self-pay

## 2019-10-28 NOTE — Telephone Encounter (Signed)
[facility] Night - Client >>>Contains Verbal Order - Signature Required<<< TELEPHONE ADVICE RECORD AccessNurse Patient Name: Fernando Bell Gender: Male DOB: [birth year] Age: 59 Y 9 M 8 D Return Phone Number: [phone] (Primary) Address: City/State/Zip: [place] Client [facility] Night - Client Client Site [facility] - Night Physician Hannah Beat - MD Contact Type Call Who Is Calling Patient / Member / Family / Caregiver Call Type Triage / Clinical Relationship To Patient Self Return Phone Number [phone] (Primary) Chief Complaint Prescription Refill or Medication Request (non symptomatic) Reason for Call Medication Question / Request Initial Comment CWBN: Needs his medication he is about to need to leave the [facility] soon. His rpescriptions expired so he is needing the refill for those 2 medications. Caller reports that he is out of his medications and would like to request a refill. 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 Needs Levothyroxine po qd and Rosuvastatin 40mg  po qd. Denies new/ worsening s/s. Covid screen neg. Allergies: IVP Dye Pharmacy: [facility], [address], [place], [phone] Does the patient have enough medication to last until the office opens? ---No Additional Documentation ---Advised will call in a few tabs to last him until Tues, then will need to call the [facility] on Tues for the needed refills. Guidelines Guideline Title Affirmed  Question Affirmed Notes Nurse Date/Time (Eastern Time) Disp. Time [phone] Time) Disposition Final User 10/25/2019 12:49:39 PM Send To Call Back Waiting For Nurse 12/26/2019 10/25/2019 12:55:22 PM Attempt made - message left Carmon, RN, 12/26/2019 10/25/2019 12:57:47 PM Attempt made - message left Carmon, RN, DenisePLEASE NOTE: All timestamps contained within this report are represented as 12/26/2019 Standard Time. CONFIDENTIALTY NOTICE: This fax transmission is intended only for the addressee. It contains information that is legally privileged, confidential or otherwise protected from use or disclosure. If you are not the intended recipient, you are strictly prohibited from reviewing, disclosing, copying using or disseminating any of this information or taking any action in reliance on or regarding this information. If you have received this fax in error, please notify [place] immediately by telephone so that we can arrange for its return to [place]. Phone: [phone], Toll-Free: [phone], Fax: [phone] Page: 2 of 3 Call Id: [phone] Disp. Time [id number] Time) Disposition Final User 10/25/2019 1:51:02 PM Pharmacy Call 12/26/2019, RN, Annye English Reason: Called Rx;s per SO to [facility] pharm 10/25/2019 1:54:01 PM Clinical Call Yes Carmon, RN, Denise Verbal Orders/Maintenance Medications Medication Refill Route Dosage Regime Duration Admin Instructions User Name Levothyroxine 12/26/2019 po qd, #4, No refills; Rosuvastatin 40mg  po qd, #4, No refills. Yes N/A Carmon, RN, Denise Comments User: Date/Time Time): 10/25/2019 1:05:52 PM Caller returning call, notified nurse, nurse will call back.

## 2019-10-28 NOTE — Telephone Encounter (Signed)
Patient called and scheduled cpx on 12/03/19.  Patient said he needs a refill on the 2 medications and Norvasc, Hydrochlorothiazide.  Patient said he'll be out of the first 2 medications by tomorrow and the other 2 before his appointment. Patient's still on the road.  Patient said he'll call Lupita Leash tomorrow to let her know where to send the medications because he's still on the road.

## 2019-10-28 NOTE — Telephone Encounter (Signed)
Left message for Fernando Bell to return my call.  I just want to verify that he is still in [place] and needs these refills.  He is also due for his Annual Physical.  Needs to schedule.

## 2019-10-29 ENCOUNTER — Other Ambulatory Visit: Payer: Self-pay | Admitting: Family Medicine

## 2019-10-29 MED ORDER — LEVOTHYROXINE SODIUM 112 MCG PO TABS: ORAL_TABLET | ORAL | 0 refills | Status: DC

## 2019-10-29 MED ORDER — AMLODIPINE BESYLATE 5 MG PO TABS: 5.0000 mg | ORAL_TABLET | Freq: Every day | ORAL | 0 refills | Status: DC

## 2019-10-29 MED ORDER — ROSUVASTATIN CALCIUM 40 MG PO TABS: 40.0000 mg | ORAL_TABLET | Freq: Every day | ORAL | 0 refills | Status: DC

## 2019-10-29 MED ORDER — HYDROCHLOROTHIAZIDE 25 MG PO TABS: 25.0000 mg | ORAL_TABLET | Freq: Every day | ORAL | 0 refills | Status: DC

## 2019-10-29 NOTE — Telephone Encounter (Signed)
[facility]  [address] [place], [place]  [address]  Pended Rx refilled to this pharmacy for one time 30-day fill.   Nothing further needed.

## 2019-11-15 ENCOUNTER — Other Ambulatory Visit: Payer: Self-pay

## 2019-11-17 MED ORDER — HYDROCHLOROTHIAZIDE 25 MG PO TABS: 25.0000 mg | ORAL_TABLET | Freq: Every day | ORAL | 0 refills | Status: DC

## 2019-12-03 ENCOUNTER — Encounter: Payer: Self-pay | Admitting: Family Medicine

## 2019-12-03 ENCOUNTER — Other Ambulatory Visit: Payer: Self-pay

## 2019-12-03 ENCOUNTER — Ambulatory Visit (INDEPENDENT_AMBULATORY_CARE_PROVIDER_SITE_OTHER): Payer: Self-pay | Admitting: Family Medicine

## 2019-12-03 VITALS — BP 140/86 | HR 94 | Temp 98.1°F | Ht 70.6 in | Wt 179.5 lb

## 2019-12-03 DIAGNOSIS — E890 Postprocedural hypothyroidism: Secondary | ICD-10-CM

## 2019-12-03 DIAGNOSIS — Z0000 Encounter for general adult medical examination without abnormal findings: Secondary | ICD-10-CM

## 2019-12-03 DIAGNOSIS — Z125 Encounter for screening for malignant neoplasm of prostate: Secondary | ICD-10-CM

## 2019-12-03 DIAGNOSIS — Z79899 Other long term (current) drug therapy: Secondary | ICD-10-CM

## 2019-12-03 DIAGNOSIS — I6309 Cerebral infarction due to thrombosis of other precerebral artery: Secondary | ICD-10-CM

## 2019-12-03 DIAGNOSIS — E785 Hyperlipidemia, unspecified: Secondary | ICD-10-CM

## 2019-12-03 LAB — CBC WITH DIFFERENTIAL/PLATELET
Basophils Absolute: 0 10*3/uL (ref 0.0–0.1)
Basophils Relative: 0.4 % (ref 0.0–3.0)
Eosinophils Absolute: 0.1 10*3/uL (ref 0.0–0.7)
Eosinophils Relative: 1.4 % (ref 0.0–5.0)
HCT: 47.4 % (ref 39.0–52.0)
Hemoglobin: 15.7 g/dL (ref 13.0–17.0)
Lymphocytes Relative: 33.7 % (ref 12.0–46.0)
Lymphs Abs: 1.7 10*3/uL (ref 0.7–4.0)
MCHC: 33.1 g/dL (ref 30.0–36.0)
MCV: 85.2 fl (ref 78.0–100.0)
Monocytes Absolute: 0.7 10*3/uL (ref 0.1–1.0)
Monocytes Relative: 13.9 % — ABNORMAL HIGH (ref 3.0–12.0)
Neutro Abs: 2.5 10*3/uL (ref 1.4–7.7)
Neutrophils Relative %: 50.6 % (ref 43.0–77.0)
Platelets: 217 10*3/uL (ref 150.0–400.0)
RBC: 5.57 Mil/uL (ref 4.22–5.81)
RDW: 16.1 % — ABNORMAL HIGH (ref 11.5–15.5)
WBC: 4.9 10*3/uL (ref 4.0–10.5)

## 2019-12-03 LAB — BASIC METABOLIC PANEL
BUN: 18 mg/dL (ref 6–23)
CO2: 25 mEq/L (ref 19–32)
Calcium: 10 mg/dL (ref 8.4–10.5)
Chloride: 103 mEq/L (ref 96–112)
Creatinine, Ser: 1.06 mg/dL (ref 0.40–1.50)
GFR: 86.27 mL/min (ref >60.00)
Glucose, Bld: 104 mg/dL — ABNORMAL HIGH (ref 70–99)
Potassium: 3.8 mEq/L (ref 3.5–5.1)
Sodium: 141 mEq/L (ref 135–145)

## 2019-12-03 LAB — HEPATIC FUNCTION PANEL
ALT: 18 U/L (ref 0–53)
AST: 22 U/L (ref 0–37)
Albumin: 4.6 g/dL (ref 3.5–5.2)
Alkaline Phosphatase: 70 U/L (ref 39–117)
Bilirubin, Direct: 0.1 mg/dL (ref 0.0–0.3)
Total Bilirubin: 0.5 mg/dL (ref 0.2–1.2)
Total Protein: 7.9 g/dL (ref 6.0–8.3)

## 2019-12-03 LAB — TSH: TSH: 1.38 u[IU]/mL (ref 0.35–4.50)

## 2019-12-03 LAB — LIPID PANEL
Cholesterol: 202 mg/dL — ABNORMAL HIGH (ref 0–200)
HDL: 78.9 mg/dL (ref >39.00)
LDL Cholesterol: 103 mg/dL — ABNORMAL HIGH (ref 0–99)
NonHDL: 123.38
Total CHOL/HDL Ratio: 3
Triglycerides: 103 mg/dL (ref 0.0–149.0)
VLDL: 20.6 mg/dL (ref 0.0–40.0)

## 2019-12-03 LAB — PSA: PSA: 1.29 ng/mL (ref 0.10–4.00)

## 2019-12-03 MED ORDER — ROSUVASTATIN CALCIUM 40 MG PO TABS: 40.0000 mg | ORAL_TABLET | Freq: Every day | ORAL | 1 refills | Status: DC

## 2019-12-03 MED ORDER — SILDENAFIL CITRATE 20 MG PO TABS: ORAL_TABLET | ORAL | 11 refills | Status: DC

## 2019-12-03 MED ORDER — HYDROCHLOROTHIAZIDE 25 MG PO TABS: 25.0000 mg | ORAL_TABLET | Freq: Every day | ORAL | 1 refills | Status: DC

## 2019-12-03 MED ORDER — LEVOTHYROXINE SODIUM 112 MCG PO TABS: ORAL_TABLET | ORAL | 1 refills | Status: DC

## 2019-12-03 MED ORDER — AMLODIPINE BESYLATE 5 MG PO TABS: 5.0000 mg | ORAL_TABLET | Freq: Every day | ORAL | 1 refills | Status: DC

## 2019-12-03 NOTE — Progress Notes (Signed)
Latifa Noble T. Constance Whittle, MD, CAQ Sports Medicine  Primary Care and Sports Medicine [facility] at [facility] [address] [place], [address]  Phone: [phone]  FAX: [phone]  Eufemio Strahm - 59 y.o. male  MRN [id number]  Date of Birth: 04/01/1961  Date: 12/03/2019  PCP: Hannah Beat, MD  Referral: Hannah Beat, MD  Chief Complaint  Patient presents with  . Annual Exam   This visit occurred during the SARS-CoV-2 public health emergency.  Safety protocols were in place, including screening questions prior to the visit, additional usage of staff PPE, and extensive cleaning of exam room while observing appropriate contact time as indicated for disinfecting solutions.   Patient Care Team: Hannah Beat, MD as PCP - General Subjective:   Rush Salce is a 59 y.o. pleasant patient who presents with the following:  Preventative Health Maintenance Visit:  Health Maintenance Summary Reviewed and updated, unless pt declines services.  Tobacco History Reviewed. Pack or so a day. Alcohol: No concerns, no excessive use.  1/5 vodka in a week. Or less in 7 days.  Exercise Habits: Some activity, rec at least 30 mins 5 times a week STD concerns: no risk or activity to increase risk Drug Use: None  Moved to [place].  Lives in [place]  DOT physical.   No kidney stones at all.   MD IN [place]. - send to [facility] for him   Health Maintenance  Topic Date Due  . INFLUENZA VACCINE  11/23/2019  . COLONOSCOPY  09/21/2021  . TETANUS/TDAP  03/09/2025  . COVID-19 Vaccine  Completed  . Hepatitis C Screening  Completed  . HIV Screening  Completed   Immunization History  Administered Date(s) Administered  . PFIZER SARS-COV-2 Vaccination 09/04/2019, 09/25/2019  . Tdap 03/10/2015   Patient Active Problem List   Diagnosis Date Noted  . 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 Hypothyroidism,  postradioiodine therapy 02/27/2011  . 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 GRAVES' DISEASE 07/28/2009  . Hyperlipidemia LDL goal <70 07/05/2008  . TOBACCO ABUSE 04/23/2008  . Essential hypertension 04/23/2008    Past Medical History:  Diagnosis Date  . BACK PAIN 04/23/2008  . Blood transfusion 1993   after hemorrhoid surgery  . 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 GRAVES' DISEASE 07/28/2009  . HYPERLIPIDEMIA 07/05/2008  . HYPERTENSION 04/23/2008  . HYPERTHYROIDISM 07/27/2009  . Hypothyroidism 04/15/2014  . Other malaise and fatigue 06/29/2009  . Proteinuria 01/25/2009  . Recurrent kidney stones   . TOBACCO ABUSE 04/23/2008    Past Surgical History:  Procedure Laterality Date  . AV FISTULA REPAIR  1993  . HEMORRHOID SURGERY  1993  . relocation of left undescended testicle at age 28    . SMALL INTESTINE SURGERY  1993   remote bowel surgeries >15 years ago    Family History  Problem Relation Age of Onset  . Graves' disease Daughter   . Stroke Sister   . CAD Father     Past Medical History, Surgical History, Social History, Family History, Problem List, Medications, and Allergies have been reviewed and updated if relevant.  Review of Systems: Pertinent positives are listed above.  Otherwise, a full 14 point review of systems has been done in full and it is negative except where it is noted positive.  Objective:   BP 140/86   Pulse 94   Temp 98.1 F (36.7 C) (Skin)   Ht 5' 10.6" (1.793 m)   Wt 179  lb 8 oz (81.4 kg)   BMI 25.32 kg/m  Ideal Body Weight: Weight in (lb) to have BMI = 25: 176.9  Ideal Body Weight: Weight in (lb) to have BMI = 25: 176.9 No exam data present Depression screen [facility] 2/9 12/03/2019 10/16/2018 06/27/2017  Decreased Interest 0 0 0  Down, Depressed, Hopeless 0 0 0  PHQ - 2 Score 0 0 0     GEN: well developed, well nourished, no acute distress Eyes: conjunctiva and lids normal, PERRLA, EOMI ENT: TM clear, nares clear, oral exam  WNL Neck: supple, no lymphadenopathy, no thyromegaly, no JVD Pulm: clear to auscultation and percussion, respiratory effort normal CV: regular rate and rhythm, S1-S2, no murmur, rub or gallop, no bruits, peripheral pulses normal and symmetric, no cyanosis, clubbing, edema or varicosities GI: soft, non-tender; no hepatosplenomegaly, masses; active bowel sounds all quadrants GU: no hernia, testicular mass, penile discharge Lymph: no cervical, axillary or inguinal adenopathy MSK: gait normal, muscle tone and strength WNL, no joint swelling, effusions, discoloration, crepitus  SKIN: clear, good turgor, color WNL, no rashes, lesions, or ulcerations Neuro: normal mental status, normal strength, sensation, and motion Psych: alert; oriented to person, place and time, normally interactive and not anxious or depressed in appearance.  All labs reviewed with patient. Results for orders placed or performed in visit on 10/14/18  TSH  Result Value Ref Range   TSH 0.82 0.35 - 4.50 uIU/mL  PSA  Result Value Ref Range   PSA 1.35 0.10 - 4.00 ng/mL  CBC with Differential/Platelet  Result Value Ref Range   WBC 4.9 4.0 - 10.5 K/uL   RBC 5.10 4.22 - 5.81 Mil/uL   Hemoglobin 14.7 13.0 - 17.0 g/dL   HCT 02.5 39 - 52 %   MCV 86.9 78.0 - 100.0 fl   MCHC 33.1 30.0 - 36.0 g/dL   RDW 42.7 (H) 06.2 - 37.6 %   Platelets 213.0 150 - 400 K/uL   Neutrophils Relative % 55.4 43 - 77 %   Lymphocytes Relative 31.4 12 - 46 %   Monocytes Relative 11.9 3 - 12 %   Eosinophils Relative 0.5 0 - 5 %   Basophils Relative 0.8 0 - 3 %   Neutro Abs 2.7 1.4 - 7.7 K/uL   Lymphs Abs 1.5 0.7 - 4.0 K/uL   Monocytes Absolute 0.6 0 - 1 K/uL   Eosinophils Absolute 0.0 0 - 0 K/uL   Basophils Absolute 0.0 0 - 0 K/uL  Basic metabolic panel  Result Value Ref Range   Sodium 139 135 - 145 mEq/L   Potassium 3.8 3.5 - 5.1 mEq/L   Chloride 104 96 - 112 mEq/L   CO2 24 19 - 32 mEq/L   Glucose, Bld 78 70 - 99 mg/dL   BUN 15 6 - 23 mg/dL    Creatinine, Ser 2.83 0.40 - 1.50 mg/dL   Calcium 9.3 8.4 - 15.1 mg/dL   GFR 76.16 >07.37 mL/min  Hepatic function panel  Result Value Ref Range   Total Bilirubin 0.4 0.2 - 1.2 mg/dL   Bilirubin, Direct 0.1 0.0 - 0.3 mg/dL   Alkaline Phosphatase 61 39 - 117 U/L   AST 24 0 - 37 U/L   ALT 19 0 - 53 U/L   Total Protein 7.0 6.0 - 8.3 g/dL   Albumin 4.4 3.5 - 5.2 g/dL  Lipid panel  Result Value Ref Range   Cholesterol 223 (H) 0 - 200 mg/dL   Triglycerides 106.2 0 - 149  mg/dL   HDL 21.3066.60 >86.57>39.00 mg/dL   VLDL 84.628.2 0.0 - 96.240.0 mg/dL   LDL Cholesterol 952128 (H) 0 - 99 mg/dL   Total CHOL/HDL Ratio 3    NonHDL 156.69     Assessment and Plan:     ICD-10-CM   1. Healthcare maintenance  Z00.00   2. Hyperlipidemia LDL goal <70  E78.5 Lipid panel  3. Hypothyroidism, postradioiodine therapy  E89.0 TSH  4. 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 Screening PSA (prostate specific antigen)  Z12.5 PSA  6. Encounter for long-term (current) use of medications  Z79.899 Basic metabolic panel    CBC with Differential/Platelet    Hepatic function panel   Refill all of his medicines.  Globally he is doing quite well.  Does have a history of stroke.  Unfortunately he is still smoking, but otherwise he is very compliant with his medication and he has been eating well.  Health Maintenance Exam: The patient's preventative maintenance and recommended screening tests for an annual wellness exam were reviewed in full today. Brought up to date unless services declined.  Counselled on the importance of diet, exercise, and its role in overall health and mortality. The patient's FH and SH was reviewed, including their home life, tobacco status, and drug and alcohol status.  Follow-up in 1 year for physical exam or additional follow-up below.  Follow-up: No follow-ups on file. Or follow-up in 1 year if not noted.  Meds ordered this encounter  Medications  .  amLODipine (NORVASC) 5 MG tablet    Sig: Take 1 tablet (5 mg total) by mouth daily.    Dispense:  90 tablet    Refill:  1  . hydrochlorothiazide (HYDRODIURIL) 25 MG tablet    Sig: Take 1 tablet (25 mg total) by mouth daily.    Dispense:  90 tablet    Refill:  1  . levothyroxine (SYNTHROID) 112 MCG tablet    Sig: TAKE 1 TABLET BY MOUTH EVERY DAY BEFORE BREAKFAST    Dispense:  90 tablet    Refill:  1  . rosuvastatin (CRESTOR) 40 MG tablet    Sig: Take 1 tablet (40 mg total) by mouth daily.    Dispense:  90 tablet    Refill:  1  . sildenafil (REVATIO) 20 MG tablet    Sig: Generic Revatio / Sildanefil 20 mg. 2 - 5 tabs 30 mins prior to intercourse.    Dispense:  50 tablet    Refill:  11   Medications Discontinued During This Encounter  Medication Reason  . pravastatin (PRAVACHOL) 80 MG tablet Change in therapy  . sildenafil (REVATIO) 20 MG tablet Reorder  . levothyroxine (SYNTHROID) 112 MCG tablet Reorder  . rosuvastatin (CRESTOR) 40 MG tablet Reorder  . amLODipine (NORVASC) 5 MG tablet Reorder  . hydrochlorothiazide (HYDRODIURIL) 25 MG tablet Reorder   Orders Placed This Encounter  Procedures  . Basic metabolic panel  . CBC with Differential/Platelet  . Hepatic function panel  . Lipid panel  . TSH  . PSA    Signed,  Karleen HampshireSpencer T. Tyshawn Ciullo, MD   Allergies as of 12/03/2019      Reactions   Iodine Shortness Of Breath      Medication List       Accurate as of December 03, 2019  2:24 PM. If you have any questions, ask your nurse or doctor.        STOP taking these medications  pravastatin 80 MG tablet Commonly known as: PRAVACHOL Stopped by: Hannah Beat, MD     TAKE these medications   amLODipine 5 MG tablet Commonly known as: NORVASC Take 1 tablet (5 mg total) by mouth daily.   aspirin 325 MG tablet Take 1 tablet (325 mg total) by mouth daily.   hydrochlorothiazide 25 MG tablet Commonly known as: HYDRODIURIL Take 1 tablet (25 mg total) by mouth daily.    levothyroxine 112 MCG tablet Commonly known as: SYNTHROID TAKE 1 TABLET BY MOUTH EVERY DAY BEFORE BREAKFAST   rosuvastatin 40 MG tablet Commonly known as: Crestor Take 1 tablet (40 mg total) by mouth daily.   sildenafil 20 MG tablet Commonly known as: Revatio Generic Revatio / Sildanefil 20 mg. 2 - 5 tabs 30 mins prior to intercourse.

## 2020-07-08 ENCOUNTER — Other Ambulatory Visit: Payer: Self-pay | Admitting: Family Medicine

## 2020-07-19 ENCOUNTER — Telehealth: Payer: Self-pay

## 2020-07-19 MED ORDER — ROSUVASTATIN CALCIUM 40 MG PO TABS: 40.0000 mg | ORAL_TABLET | Freq: Every day | ORAL | 0 refills | Status: DC

## 2020-07-19 MED ORDER — LEVOTHYROXINE SODIUM 112 MCG PO TABS: ORAL_TABLET | ORAL | 0 refills | Status: DC

## 2020-07-19 MED ORDER — AMLODIPINE BESYLATE 5 MG PO TABS: 5.0000 mg | ORAL_TABLET | Freq: Every day | ORAL | 0 refills | Status: DC

## 2020-07-19 NOTE — Telephone Encounter (Signed)
[facility] Night - Client TELEPHONE ADVICE RECORD Access[employer]urse Patient Name: Fernando Bell Gender: Male DOB: Apr 30, 1960 Age: 60 Y 6 M 1 D Return Phone Number: [phone] (Primary) Address: City/State/Zip: [place] [address] Client [facility] Night - Client Client Site [facility] - Night Physician Hannah Beat - MD Contact Type Call Who Is Calling Patient / Member / Family / Caregiver Call Type Triage / Clinical Relationship To Patient Self Return Phone Number [phone] (Primary) Chief Complaint Prescription Refill or Medication Request (non symptomatic) Reason for Call Medication Question / Request Initial Comment Caller says that he needs to get refills on his meds thyroid, cholesterol and Norvic, and one more med . He entered the request on MyChart 1.5 weeks ago and it showed he had one refill. But he was not in town and Lupita Leash advised him to call to have the script called where he is. 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 Needs to have the med called [facility] today to a [facility]. He does not have a thyroid, and needs the meds called in by a provider; he is out. Translation No Nurse Assessment Nurse: Izora Ribas, RN, Melanie Date/Time (Eastern Time): 07/17/2020 11:23:38 AM Confirm and document reason for call. If symptomatic, describe symptoms. ---Caller says that he needs to get refills on his meds thyroid, cholesterol and Norvasc. He entered the request on MyChart 1.5 weeks ago and it showed he had one refill. But [facility] says otherwise. 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 Needs to have the med called in today to a [facility]. He does not have a thyroid, and needs the meds called in by a provider; he is out. Does the patient have any new or worsening symptoms? ---No Nurse: Izora Ribas, RN, Melanie Date/Time (Eastern Time): 07/17/2020 11:30:13 AM Please select the assessment type ---Refill Does the patient have enough  medication to last until the office opens? ---Unable to obtain loaner dose from Pharmacy Does the client directives allow for assistance with medications after hours? ---Yes Was the medication filled within the last 6 months? ---Yes What is the name of the medication, dose and instructions as listed on the bottle? ---Amlodipine 5mg  daily, Levothyroxine daily before breakfast, Rosuvastatin 40mg  daily Name of the physician as listed on the bottle. ---[place] PLEASE NOTE: All timestamps contained within this report are represented as Standard Time. CONFIDENTIALTY NOTICE: This fax transmission is intended only for the addressee. It contains information that is legally privileged, confidential or otherwise protected from use or disclosure. If you are not the intended recipient, you are strictly prohibited from reviewing, disclosing, copying using or disseminating any of this information or taking any action in reliance on or regarding this information. If you have received this fax in error, please notify immediately by telephone so that we can arrange for its return to [place]. Phone: [phone], Toll-Free: [phone], Fax: [phone] Page: 2 of 2 Call Id: [phone] Nurse Assessment Pharmacy name and phone number where most recently filled. ---[phone] Disp. Time [place] Time) Disposition Final User 07/17/2020 11:05:31 AM Attempt made - message left Lamount Cohen 07/17/2020 11:35:20 AM Pharmacy Call Kennieth Francois, RN, Melanie Reason: 07/19/2020, confirmed will be able to get the meds at the [facility] if called into the original pharmacy. Filled for 5 doses to get him through till office opens and can send in the full prescription per the directives. 07/17/2020 11:38:47 AM Clinical Call Yes [facility], RN,  Melanie Comments User: Patria Mane, RN Date/Time Lamount Cohen Time): 07/17/2020 11:38:38 AM Pt aware of small amount refill and will call the office on Monday  for the full amount to get him through till Mother's day weekend. There is another medication that will need a refill when he gets home.

## 2020-07-19 NOTE — Telephone Encounter (Signed)
Pharmacy requests refill on: Amlodipine 5 mg, Levothyroxine 112 mcg & Rosuvastatin 40 mg   LAST REFILL: 12/03/2019 (Q-90, R-1) LAST OV: 12/03/2019 NEXT OV: Not Scheduled  PHARMACY: [facility] [place], [place]

## 2020-11-01 ENCOUNTER — Other Ambulatory Visit: Payer: Self-pay

## 2020-11-01 MED ORDER — ROSUVASTATIN CALCIUM 40 MG PO TABS: 40.0000 mg | ORAL_TABLET | Freq: Every day | ORAL | 0 refills | Status: DC

## 2020-11-01 MED ORDER — SILDENAFIL CITRATE 20 MG PO TABS: ORAL_TABLET | ORAL | 0 refills | Status: DC

## 2020-11-01 MED ORDER — LEVOTHYROXINE SODIUM 112 MCG PO TABS: ORAL_TABLET | ORAL | 0 refills | Status: DC

## 2020-11-01 MED ORDER — AMLODIPINE BESYLATE 5 MG PO TABS: 5.0000 mg | ORAL_TABLET | Freq: Every day | ORAL | 0 refills | Status: DC

## 2020-11-01 MED ORDER — HYDROCHLOROTHIAZIDE 25 MG PO TABS: 25.0000 mg | ORAL_TABLET | Freq: Every day | ORAL | 0 refills | Status: DC

## 2021-01-17 ENCOUNTER — Other Ambulatory Visit: Payer: Self-pay

## 2021-01-17 ENCOUNTER — Encounter: Payer: Self-pay | Admitting: Family Medicine

## 2021-01-24 ENCOUNTER — Encounter: Payer: Self-pay | Admitting: Family Medicine

## 2021-03-10 ENCOUNTER — Other Ambulatory Visit: Payer: Self-pay | Admitting: Family Medicine

## 2021-03-10 MED ORDER — ROSUVASTATIN CALCIUM 40 MG PO TABS: 40.0000 mg | ORAL_TABLET | Freq: Every day | ORAL | 0 refills | Status: DC

## 2021-03-10 MED ORDER — HYDROCHLOROTHIAZIDE 25 MG PO TABS: 25.0000 mg | ORAL_TABLET | Freq: Every day | ORAL | 0 refills | Status: DC

## 2021-03-10 MED ORDER — AMLODIPINE BESYLATE 5 MG PO TABS: 5.0000 mg | ORAL_TABLET | Freq: Every day | ORAL | 0 refills | Status: DC

## 2021-03-10 MED ORDER — SILDENAFIL CITRATE 20 MG PO TABS: ORAL_TABLET | ORAL | 0 refills | Status: DC

## 2021-03-10 MED ORDER — LEVOTHYROXINE SODIUM 112 MCG PO TABS: ORAL_TABLET | ORAL | 0 refills | Status: DC

## 2021-04-13 ENCOUNTER — Encounter: Payer: Self-pay | Admitting: Family Medicine

## 2021-04-27 ENCOUNTER — Encounter: Payer: Self-pay | Admitting: Family Medicine

## 2021-06-26 ENCOUNTER — Other Ambulatory Visit: Payer: Self-pay | Admitting: Family Medicine

## 2021-07-21 ENCOUNTER — Other Ambulatory Visit: Payer: Self-pay | Admitting: Family Medicine

## 2021-07-21 MED ORDER — HYDROCHLOROTHIAZIDE 25 MG PO TABS: 25.0000 mg | ORAL_TABLET | Freq: Every day | ORAL | 0 refills | Status: DC

## 2021-07-21 MED ORDER — LEVOTHYROXINE SODIUM 112 MCG PO TABS: ORAL_TABLET | ORAL | 0 refills | Status: DC

## 2021-07-21 MED ORDER — ROSUVASTATIN CALCIUM 40 MG PO TABS: 40.0000 mg | ORAL_TABLET | Freq: Every day | ORAL | 0 refills | Status: DC

## 2021-07-21 MED ORDER — AMLODIPINE BESYLATE 5 MG PO TABS: 5.0000 mg | ORAL_TABLET | Freq: Every day | ORAL | 0 refills | Status: DC

## 2021-07-21 MED ORDER — SILDENAFIL CITRATE 20 MG PO TABS: ORAL_TABLET | ORAL | 0 refills | Status: DC

## 2021-07-24 ENCOUNTER — Encounter: Payer: Self-pay | Admitting: Family Medicine

## 2021-07-28 ENCOUNTER — Encounter: Payer: Self-pay | Admitting: Family Medicine

## 2021-08-03 ENCOUNTER — Encounter: Payer: Self-pay | Admitting: Family Medicine

## 2021-08-03 ENCOUNTER — Other Ambulatory Visit (INDEPENDENT_AMBULATORY_CARE_PROVIDER_SITE_OTHER): Payer: Self-pay

## 2021-08-03 ENCOUNTER — Other Ambulatory Visit: Payer: Self-pay

## 2021-08-03 DIAGNOSIS — Z125 Encounter for screening for malignant neoplasm of prostate: Secondary | ICD-10-CM

## 2021-08-03 DIAGNOSIS — Z79899 Other long term (current) drug therapy: Secondary | ICD-10-CM

## 2021-08-03 DIAGNOSIS — Z131 Encounter for screening for diabetes mellitus: Secondary | ICD-10-CM

## 2021-08-03 DIAGNOSIS — E785 Hyperlipidemia, unspecified: Secondary | ICD-10-CM

## 2021-08-03 DIAGNOSIS — E890 Postprocedural hypothyroidism: Secondary | ICD-10-CM

## 2021-08-03 LAB — LIPID PANEL
Cholesterol: 199 mg/dL (ref 0–200)
HDL: 88 mg/dL (ref >39.00)
LDL Cholesterol: 93 mg/dL (ref 0–99)
NonHDL: 111.12
Total CHOL/HDL Ratio: 2
Triglycerides: 89 mg/dL (ref 0.0–149.0)
VLDL: 17.8 mg/dL (ref 0.0–40.0)

## 2021-08-03 LAB — BASIC METABOLIC PANEL
BUN: 14 mg/dL (ref 6–23)
CO2: 29 mEq/L (ref 19–32)
Calcium: 9.3 mg/dL (ref 8.4–10.5)
Chloride: 101 mEq/L (ref 96–112)
Creatinine, Ser: 0.93 mg/dL (ref 0.40–1.50)
GFR: 88.6 mL/min (ref >60.00)
Glucose, Bld: 91 mg/dL (ref 70–99)
Potassium: 3.6 mEq/L (ref 3.5–5.1)
Sodium: 139 mEq/L (ref 135–145)

## 2021-08-03 LAB — TSH: TSH: 7.98 u[IU]/mL — ABNORMAL HIGH (ref 0.35–5.50)

## 2021-08-03 LAB — HEPATIC FUNCTION PANEL
ALT: 19 U/L (ref 0–53)
AST: 23 U/L (ref 0–37)
Albumin: 4.6 g/dL (ref 3.5–5.2)
Alkaline Phosphatase: 63 U/L (ref 39–117)
Bilirubin, Direct: 0.1 mg/dL (ref 0.0–0.3)
Total Bilirubin: 0.5 mg/dL (ref 0.2–1.2)
Total Protein: 7.3 g/dL (ref 6.0–8.3)

## 2021-08-03 LAB — CBC WITH DIFFERENTIAL/PLATELET
Basophils Absolute: 0 10*3/uL (ref 0.0–0.1)
Basophils Relative: 0.4 % (ref 0.0–3.0)
Eosinophils Absolute: 0.1 10*3/uL (ref 0.0–0.7)
Eosinophils Relative: 2.1 % (ref 0.0–5.0)
HCT: 45.5 % (ref 39.0–52.0)
Hemoglobin: 14.9 g/dL (ref 13.0–17.0)
Lymphocytes Relative: 27.4 % (ref 12.0–46.0)
Lymphs Abs: 1.4 10*3/uL (ref 0.7–4.0)
MCHC: 32.8 g/dL (ref 30.0–36.0)
MCV: 86.3 fl (ref 78.0–100.0)
Monocytes Absolute: 0.8 10*3/uL (ref 0.1–1.0)
Monocytes Relative: 14.8 % — ABNORMAL HIGH (ref 3.0–12.0)
Neutro Abs: 2.9 10*3/uL (ref 1.4–7.7)
Neutrophils Relative %: 55.3 % (ref 43.0–77.0)
Platelets: 197 10*3/uL (ref 150.0–400.0)
RBC: 5.28 Mil/uL (ref 4.22–5.81)
RDW: 17.1 % — ABNORMAL HIGH (ref 11.5–15.5)
WBC: 5.2 10*3/uL (ref 4.0–10.5)

## 2021-08-03 LAB — PSA: PSA: 1.28 ng/mL (ref 0.10–4.00)

## 2021-08-03 LAB — HEMOGLOBIN A1C: Hgb A1c MFr Bld: 5.7 % (ref 4.6–6.5)

## 2021-08-05 ENCOUNTER — Encounter: Payer: Self-pay | Admitting: Family Medicine

## 2021-08-05 NOTE — Progress Notes (Signed)
? ? ?Antavion Bartoszek T. Nyiesha Beever, MD, CAQ Sports Medicine ?Nature conservation officer at [facility] ?[address] [place] ?[place], [address] ? ?Phone: [phone]  FAX: [phone] ? ?Ludwin Flahive - 61 y.o. male  MRN [id number]  Date of Birth: [birth year] ? ?Date: 08/08/2021  PCP: Hannah Beat, MD  Referral: Hannah Beat, MD ? ?Chief Complaint  ?Patient presents with  ? Annual Exam  ? ? ?This visit occurred during the SARS-CoV-2 public health emergency.  Safety protocols were in place, including screening questions prior to the visit, additional usage of staff PPE, and extensive cleaning of exam room while observing appropriate contact time as indicated for disinfecting solutions.  ? ?Patient Care Team: ?Hannah Beat, MD as PCP - General ?Subjective:  ? ?Almin Livingstone is a 61 y.o. pleasant patient who presents with the following: ? ?Preventative Health Maintenance Visit: ? ?Health Maintenance Summary Reviewed and updated, unless pt declines services. ? ?Tobacco History Reviewed. 1 PPD.  ?Alcohol: Beer and some vodka. Vodka, pint sometimes. ?Exercise Habits: Some activity, rec at least 30 mins 5 times a week ?STD concerns: no risk or activity to increase risk ?Drug Use: None ? ?Thyroid: No symptoms. Labs reviewed. Denies cold / heat intolerance, dry skin, hair loss. No goiter. ? ? ? ?Lab Results  ?Component Value Date  ? TSH 7.98 (H) 08/03/2021  ?  ?Colon repeat ? ?Booster for covid, too. ?No flu shot ever ?No shingles vaccine ?Table colonoscopy ? ?Sciatica will hurt when getting up some in the morning.  ?Then it will go away. ?- will last for about 30-45 minutes ? ?He does have some dental caries.  He is going to hold off on getting this fixed right now. ? ?Health Maintenance  ?Topic Date Due  ? Zoster Vaccines- Shingrix (1 of 2) Never done  ? COVID-19 Vaccine (4 - Booster for Pfizer series) 03/20/2021  ? COLONOSCOPY (Pts 45-32yrs Insurance coverage will need to be confirmed)  09/21/2021  ? INFLUENZA  VACCINE  11/22/2021  ? TETANUS/TDAP  03/09/2025  ? Hepatitis C Screening  Completed  ? HIV Screening  Completed  ? HPV VACCINES  Aged Out  ? ?Immunization History  ?Administered Date(s) Administered  ? PFIZER(Purple Top)SARS-COV-2 Vaccination 09/04/2019, 09/25/2019  ? 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 Tdap 03/10/2015  ? ?Patient Active Problem List  ? Diagnosis Date Noted  ? 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 Hypothyroidism, postradioiodine therapy 02/27/2011  ? ERECTILE DYSFUNCTION, 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  ? GRAVES' DISEASE 07/28/2009  ? Hyperlipidemia LDL goal <70 07/05/2008  ? TOBACCO ABUSE 04/23/2008  ? Essential hypertension 04/23/2008  ? ? ?Past Medical History:  ?Diagnosis Date  ? ERECTILE DYSFUNCTION, 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  ? GRAVES' DISEASE 07/28/2009  ? HYPERLIPIDEMIA 07/05/2008  ? HYPERTENSION 04/23/2008  ? HYPERTHYROIDISM 07/27/2009  ? Hypothyroidism 04/15/2014  ? Recurrent kidney stones   ? TOBACCO ABUSE 04/23/2008  ? ? ?Past Surgical History:  ?Procedure Laterality Date  ? AV FISTULA REPAIR  1993  ? HEMORRHOID SURGERY  1993  ? relocation of left undescended testicle at age 65    ? SMALL INTESTINE SURGERY  1993  ? remote bowel surgeries >15 years ago  ? ? ?Family History  ?Problem Relation Age of Onset  ? Graves' disease Daughter   ? Stroke Sister   ? CAD Father   ? ? ?Past Medical History, Surgical History, Social History, Family History, Problem List, Medications, and Allergies have been reviewed and updated  if relevant. ? ?Review of Systems: Pertinent positives are listed above.  Otherwise, a full 14 point review of systems has been done in full and it is negative except where it is noted positive. ? ?Objective:  ? ?BP (!) 126/92   Pulse 96   Temp 98.4 ?F (36.9 ?C) (Oral)   Ht 5' 10.5" (1.791 m)   Wt 171 lb 8 oz (77.8 kg)   SpO2 98%   BMI 24.26 kg/m?  ?Ideal Body Weight: Weight in (lb) to have BMI = 25: 176.4 ? ?Ideal  Body Weight: Weight in (lb) to have BMI = 25: 176.4 ?No results found. ? ?  08/08/2021  ?  8:26 AM 12/03/2019  ? 10:32 AM 10/16/2018  ?  9:22 AM 06/27/2017  ? 10:22 AM  ?Depression screen PHQ 2/9  ?Decreased Interest 0 0 0 0  ?Down, Depressed, Hopeless 0 0 0 0  ?PHQ - 2 Score 0 0 0 0  ? ? ? ?GEN: well developed, well nourished, no acute distress ?Eyes: conjunctiva and lids normal, PERRLA, EOMI ?ENT: TM clear, nares clear, oral exam WNL ?Neck: supple, no lymphadenopathy, no thyromegaly, no JVD ?Pulm: clear to auscultation and percussion, respiratory effort normal ?CV: regular rate and rhythm, S1-S2, no murmur, rub or gallop, no bruits, peripheral pulses normal and symmetric, no cyanosis, clubbing, edema or varicosities ?GI: soft, non-tender; no hepatosplenomegaly, masses; active bowel sounds all quadrants ?GU: deferred ?Lymph: no cervical, axillary or inguinal adenopathy ?MSK: gait normal, muscle tone and strength WNL, no joint swelling, effusions, discoloration, crepitus  ?SKIN: clear, good turgor, color WNL, no rashes, lesions, or ulcerations ?Neuro: normal mental status, normal strength, sensation, and motion ?Psych: alert; oriented to person, place and time, normally interactive and not anxious or depressed in appearance. ? ?All labs reviewed with patient. ?Results for orders placed or performed in visit on 08/03/21  ?Lipid panel  ?Result Value Ref Range  ? Cholesterol 199 0 - 200 mg/dL  ? Triglycerides 89.0 0.0 - 149.0 mg/dL  ? HDL 88.00 >39.00 mg/dL  ? VLDL 17.8 0.0 - 40.0 mg/dL  ? LDL Cholesterol 93 0 - 99 mg/dL  ? Total CHOL/HDL Ratio 2   ? NonHDL 111.12   ?Hepatic function panel  ?Result Value Ref Range  ? Total Bilirubin 0.5 0.2 - 1.2 mg/dL  ? Bilirubin, Direct 0.1 0.0 - 0.3 mg/dL  ? Alkaline Phosphatase 63 39 - 117 U/L  ? AST 23 0 - 37 U/L  ? ALT 19 0 - 53 U/L  ? Total Protein 7.3 6.0 - 8.3 g/dL  ? Albumin 4.6 3.5 - 5.2 g/dL  ?Basic metabolic panel  ?Result Value Ref Range  ? Sodium 139 135 - 145 mEq/L  ?  Potassium 3.6 3.5 - 5.1 mEq/L  ? Chloride 101 96 - 112 mEq/L  ? CO2 29 19 - 32 mEq/L  ? Glucose, Bld 91 70 - 99 mg/dL  ? BUN 14 6 - 23 mg/dL  ? Creatinine, Ser 0.93 0.40 - 1.50 mg/dL  ? GFR 88.60 >60.00 mL/min  ? Calcium 9.3 8.4 - 10.5 mg/dL  ?CBC with Differential/Platelet  ?Result Value Ref Range  ? WBC 5.2 4.0 - 10.5 K/uL  ? RBC 5.28 4.22 - 5.81 Mil/uL  ? Hemoglobin 14.9 13.0 - 17.0 g/dL  ? HCT 45.5 39.0 - 52.0 %  ? MCV 86.3 78.0 - 100.0 fl  ? MCHC 32.8 30.0 - 36.0 g/dL  ? RDW 17.1 (H) 11.5 - 15.5 %  ? Platelets 197.0 150.0 -  400.0 K/uL  ? Neutrophils Relative % 55.3 43.0 - 77.0 %  ? Lymphocytes Relative 27.4 12.0 - 46.0 %  ? Monocytes Relative 14.8 (H) 3.0 - 12.0 %  ? Eosinophils Relative 2.1 0.0 - 5.0 %  ? Basophils Relative 0.4 0.0 - 3.0 %  ? Neutro Abs 2.9 1.4 - 7.7 K/uL  ? Lymphs Abs 1.4 0.7 - 4.0 K/uL  ? Monocytes Absolute 0.8 0.1 - 1.0 K/uL  ? Eosinophils Absolute 0.1 0.0 - 0.7 K/uL  ? Basophils Absolute 0.0 0.0 - 0.1 K/uL  ?Hemoglobin A1c  ?Result Value Ref Range  ? Hgb A1c MFr Bld 5.7 4.6 - 6.5 %  ?PSA  ?Result Value Ref Range  ? PSA 1.28 0.10 - 4.00 ng/mL  ?TSH  ?Result Value Ref Range  ? TSH 7.98 (H) 0.35 - 5.50 uIU/mL  ? ? ?Assessment and Plan:  ? ?  ICD-10-CM   ?1. Encounter for health maintenance examination with abnormal findings  Z00.01   ?  ?2. Hypothyroidism, postradioiodine therapy  E89.0 T4, free  ?  T3, free  ?  TSH  ?  ? ?Primary issue for him is to try to quit smoking.  He also knows that his alcohol intake is too high. ? ?Recheck TSH, add T3 and T4.  Adjust medication if still abnormal. ? ?Health Maintenance Exam: The patient's preventative maintenance and recommended screening tests for an annual wellness exam were reviewed in full today. ?Brought up to date unless services declined. ? ?Counselled on the importance of diet, exercise, and its role in overall health and mortality. ?The patient's FH and SH was reviewed, including their home life, tobacco status, and drug and alcohol  status. ? ?Follow-up in 1 year for physical exam or additional follow-up below. ? ?Follow-up: Return in about 1 month (around 09/07/2021) for blood draw for thyroid recheck only. ?Or follow-up in 1 year if not noted. ? ?No orders o

## 2021-08-08 ENCOUNTER — Encounter: Payer: Self-pay | Admitting: Family Medicine

## 2021-08-08 ENCOUNTER — Ambulatory Visit (INDEPENDENT_AMBULATORY_CARE_PROVIDER_SITE_OTHER): Payer: Self-pay | Admitting: Family Medicine

## 2021-08-08 VITALS — BP 126/92 | HR 96 | Temp 98.4°F | Ht 70.5 in | Wt 171.5 lb

## 2021-08-08 DIAGNOSIS — Z0001 Encounter for general adult medical examination with abnormal findings: Secondary | ICD-10-CM

## 2021-08-08 DIAGNOSIS — E890 Postprocedural hypothyroidism: Secondary | ICD-10-CM

## 2021-08-15 ENCOUNTER — Other Ambulatory Visit: Payer: Self-pay

## 2021-08-22 ENCOUNTER — Encounter: Payer: Self-pay | Admitting: Family Medicine

## 2021-09-12 ENCOUNTER — Other Ambulatory Visit: Payer: Self-pay

## 2021-10-17 ENCOUNTER — Encounter: Payer: Self-pay | Admitting: Internal Medicine

## 2021-11-03 ENCOUNTER — Other Ambulatory Visit: Payer: Self-pay | Admitting: Family Medicine

## 2021-11-03 ENCOUNTER — Encounter: Payer: Self-pay | Admitting: Family Medicine

## 2021-11-03 DIAGNOSIS — E890 Postprocedural hypothyroidism: Secondary | ICD-10-CM

## 2021-11-03 DIAGNOSIS — N529 Male erectile dysfunction, unspecified: Secondary | ICD-10-CM

## 2021-11-03 DIAGNOSIS — E785 Hyperlipidemia, unspecified: Secondary | ICD-10-CM

## 2021-11-03 DIAGNOSIS — I10 Essential (primary) hypertension: Secondary | ICD-10-CM

## 2021-11-03 MED ORDER — ROSUVASTATIN CALCIUM 40 MG PO TABS: 40.0000 mg | ORAL_TABLET | Freq: Every day | ORAL | 3 refills | Status: DC

## 2021-11-03 MED ORDER — LEVOTHYROXINE SODIUM 112 MCG PO TABS: ORAL_TABLET | ORAL | 0 refills | Status: DC

## 2021-11-03 MED ORDER — AMLODIPINE BESYLATE 5 MG PO TABS: 5.0000 mg | ORAL_TABLET | Freq: Every day | ORAL | 3 refills | Status: DC

## 2021-11-03 MED ORDER — HYDROCHLOROTHIAZIDE 25 MG PO TABS: 25.0000 mg | ORAL_TABLET | Freq: Every day | ORAL | 3 refills | Status: DC

## 2021-11-03 MED ORDER — SILDENAFIL CITRATE 20 MG PO TABS: ORAL_TABLET | ORAL | 11 refills | Status: DC

## 2021-11-23 HISTORY — PX: PERCUTANEOUS CORONARY STENT INTERVENTION (PCI-S): SHX6016

## 2021-11-30 ENCOUNTER — Encounter: Payer: Self-pay | Admitting: Family Medicine

## 2021-11-30 NOTE — Telephone Encounter (Signed)
Can you call and see if they will send [place] a hospital discharge summary?  Rolland will need a hospital follow-up, too.

## 2021-11-30 NOTE — Telephone Encounter (Signed)
Record request for hospital discharge summary faxed to [facility] at [facility] [phone].

## 2021-12-06 ENCOUNTER — Telehealth: Payer: Self-pay | Admitting: Family Medicine

## 2021-12-06 NOTE — Telephone Encounter (Signed)
Please advise 

## 2021-12-06 NOTE — Telephone Encounter (Signed)
Patient came into the office stating that he was told by Dr. Patsy Lager to stop by the office to review his meds.  Is aware md not here today, can we double book for tomorrow?

## 2021-12-07 NOTE — Telephone Encounter (Signed)
No.  There must have been some confusion, but he does need a non-urgent hospital follow-up with me.

## 2021-12-07 NOTE — Telephone Encounter (Signed)
Called patient left VM advised to call back to schedule appointment.

## 2021-12-11 ENCOUNTER — Encounter: Payer: Self-pay | Admitting: Family Medicine

## 2021-12-11 DIAGNOSIS — I2102 ST elevation (STEMI) myocardial infarction involving left anterior descending coronary artery: Secondary | ICD-10-CM

## 2021-12-11 DIAGNOSIS — I2510 Atherosclerotic heart disease of native coronary artery without angina pectoris: Secondary | ICD-10-CM | POA: Insufficient documentation

## 2021-12-11 DIAGNOSIS — I5022 Chronic systolic (congestive) heart failure: Secondary | ICD-10-CM

## 2021-12-11 HISTORY — DX: Atherosclerotic heart disease of native coronary artery without angina pectoris: I25.10

## 2021-12-11 HISTORY — DX: ST elevation (STEMI) myocardial infarction involving left anterior descending coronary artery: I21.02

## 2021-12-11 HISTORY — DX: Chronic systolic (congestive) heart failure: I50.22

## 2021-12-11 NOTE — Progress Notes (Deleted)
    Fernando Cleckley T. Khalil Belote, MD, CAQ Sports Medicine [facility] at [facility] [address] [place], [address]  Phone: [phone]  FAX: [phone]  Fernando Bell - 61 y.o. male  MRN [id number]  Date of Birth: 07-11-1960  Date: 12/12/2021  PCP: Hannah Beat, MD  Referral: Hannah Beat, MD  No chief complaint on file.  Subjective:   Fernando Bell is a 61 y.o. very pleasant male patient with There is no height or weight on file to calculate BMI. who presents with the following:  Very well-known patient with a history of prior cerebrovascular disease and CVA who presents after ST elevation MI involving the left anterior descending artery.  Status post percutaneous intervention, and now he has new onset heart failure.  Date of admission: November 23, 2021 Date of discharge November 28, 2021.  [facility].  [facility] - records reviewed.  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  He pulled over and called EMS and was found to have an ST elevation MI and went directly to the cardiac catheterization.  He was admitted, status post PCI he was placed on Brilinta and Farxiga.   Troponin had a peak of 17.  He was discharged on his home Crestor with consideration of Zetia Toprol-XL 25 mg twice daily Losartan 25 mg daily Aspirin indefinitely Marcelline Deist also began by Cardiology Brilinta x 30 days, and then he can continue with Plavix after his 30 days of Brilinta.  New onset congestive heart failure with an EF of 35%.  Confirmed via echo, and while no valvular disease, recommendation from cardiology was to continue beta-blocker, losartan.  LifeVest that was discussed, but declined.  At the time of his admission, he had discontinued smoking.  Multivessel coronary disease.  The mid LAD had a 95% stenosis. First  diagonal with 90% stenosis Distal circumflex with 80% stenosis Third obtuse marginal with 90% stenosis Mid right coronary artery is 80% stenosis  There was a mid LAD drug-eluting stent placed.    Review of Systems is noted in the HPI, as appropriate  Objective:   There were no vitals taken for this visit.  GEN: No acute distress; alert,appropriate. PULM: Breathing comfortably in no respiratory distress PSYCH: Normally interactive.   Laboratory and Imaging Data:  Assessment and Plan:   ***

## 2021-12-12 ENCOUNTER — Inpatient Hospital Stay: Payer: Self-pay | Admitting: Family Medicine

## 2021-12-12 DIAGNOSIS — F17200 Nicotine dependence, unspecified, uncomplicated: Secondary | ICD-10-CM

## 2021-12-12 DIAGNOSIS — I5022 Chronic systolic (congestive) heart failure: Secondary | ICD-10-CM

## 2021-12-12 DIAGNOSIS — I2510 Atherosclerotic heart disease of native coronary artery without angina pectoris: Secondary | ICD-10-CM

## 2021-12-12 DIAGNOSIS — E785 Hyperlipidemia, unspecified: Secondary | ICD-10-CM

## 2021-12-12 DIAGNOSIS — I10 Essential (primary) hypertension: Secondary | ICD-10-CM

## 2021-12-12 DIAGNOSIS — I2102 ST elevation (STEMI) myocardial infarction involving left anterior descending coronary artery: Secondary | ICD-10-CM

## 2021-12-14 ENCOUNTER — Ambulatory Visit (INDEPENDENT_AMBULATORY_CARE_PROVIDER_SITE_OTHER): Payer: Self-pay | Admitting: Family Medicine

## 2021-12-14 ENCOUNTER — Encounter: Payer: Self-pay | Admitting: Family Medicine

## 2021-12-14 VITALS — BP 120/80 | HR 62 | Temp 98.4°F | Ht 70.5 in | Wt 170.1 lb

## 2021-12-14 DIAGNOSIS — E890 Postprocedural hypothyroidism: Secondary | ICD-10-CM

## 2021-12-14 DIAGNOSIS — E876 Hypokalemia: Secondary | ICD-10-CM

## 2021-12-14 DIAGNOSIS — I2102 ST elevation (STEMI) myocardial infarction involving left anterior descending coronary artery: Secondary | ICD-10-CM

## 2021-12-14 DIAGNOSIS — I5022 Chronic systolic (congestive) heart failure: Secondary | ICD-10-CM

## 2021-12-14 DIAGNOSIS — I5020 Unspecified systolic (congestive) heart failure: Secondary | ICD-10-CM

## 2021-12-14 LAB — BASIC METABOLIC PANEL
BUN: 12 mg/dL (ref 6–23)
CO2: 26 mEq/L (ref 19–32)
Calcium: 9.8 mg/dL (ref 8.4–10.5)
Chloride: 103 mEq/L (ref 96–112)
Creatinine, Ser: 1.02 mg/dL (ref 0.40–1.50)
GFR: 79.1 mL/min (ref >60.00)
Glucose, Bld: 84 mg/dL (ref 70–99)
Potassium: 4.1 mEq/L (ref 3.5–5.1)
Sodium: 139 mEq/L (ref 135–145)

## 2021-12-14 LAB — T3, FREE: T3, Free: 2.8 pg/mL (ref 2.3–4.2)

## 2021-12-14 LAB — TSH: TSH: 0.39 u[IU]/mL (ref 0.35–5.50)

## 2021-12-14 LAB — T4, FREE: Free T4: 1.15 ng/dL (ref 0.60–1.60)

## 2021-12-14 MED ORDER — METOPROLOL SUCCINATE ER 25 MG PO TB24: 25.0000 mg | ORAL_TABLET | Freq: Every day | ORAL | 1 refills | Status: DC

## 2021-12-14 MED ORDER — DAPAGLIFLOZIN PROPANEDIOL 10 MG PO TABS: 10.0000 mg | ORAL_TABLET | Freq: Every day | ORAL | 1 refills | Status: AC

## 2021-12-14 MED ORDER — CLOPIDOGREL BISULFATE 75 MG PO TABS: 75.0000 mg | ORAL_TABLET | Freq: Every day | ORAL | 1 refills | Status: DC

## 2021-12-14 MED ORDER — LOSARTAN POTASSIUM 25 MG PO TABS: 25.0000 mg | ORAL_TABLET | Freq: Every day | ORAL | 1 refills | Status: DC

## 2021-12-14 NOTE — Patient Instructions (Addendum)
Levonne Lapping, close to [place] - in my residency class   Tasia Catchings or [facility].  Katherine Mantle was one of my teachers in Residency   Erik Obey in [place]. In my residency - his practice will be the most similar to mine.  Mellody Dance also did a Sports Med fellowship.   Zane Herald - she was my Sales executive in residency, but her practice is in the middle of [place] now.  When you finish your Brilinta, you need to start taking Plavix instead

## 2021-12-14 NOTE — Progress Notes (Signed)
Ashawnti Tangen T. Johann Gascoigne, MD, CAQ Sports Medicine [facility] at [facility] [address] [place], [address]  Phone: [phone]  FAX: [phone]  Omarie Parcell - 61 y.o. male  MRN [id number]  Date of Birth: Dec 04, 1960  Date: 12/14/2021  PCP: Hannah Beat, MD  Referral: Hannah Beat, MD  Chief Complaint  Patient presents with   Hospitalization Follow-up   Subjective:   Limmie Schoenberg is a 61 y.o. very pleasant male patient with Body mass index is 24.07 kg/m. 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  EMS found ST elevation on EKG and he was ultimately taken immediately to the Cath Lab.  He was found to have a 99% blockage of the mid LAD, which was responsive to percutaneous intervention. Peak troponin of 17 80% blockage circumflex, 80% OM 2, 80% small vessel, mid RCA  He was discharged on Crestor, Toprol XL 25 mg p.o. twice daily Losartan 25 mg daily Aspirin, 81 mg  He also started the patient on Brilinta, and he was given enough to last 30 days.  Subsequently, recommended that he start on aspirin as well as Plavix after Brilinta is finished.  New onset congestive heart failure EF of 35%.  Beta-blocker and losartan. He was also started on Farxiga, given 1 month samples.  Thankfully at the time of his discharge he had discontinued smoking.  Hypokalemia while on admission, and will need repeat BMP.  He is doing okay, but he has started smoking again.  He is accompanied by his wife today provides additional history.  Review of Systems is noted in the HPI, as appropriate  Patient Active Problem List   Diagnosis Date  Noted   ST elevation myocardial infarction involving left anterior descending (LAD) coronary artery (HCC) 12/11/2021    Priority: High   Chronic systolic heart failure (HCC) 12/11/2021    Priority: High   Coronary Artery Disease (CAD) 12/11/2021    Priority: High   Cerebral infarction due to thrombosis of other precerebral artery (HCC) 09/23/2014    Priority: High   Hypothyroidism, postradioiodine therapy 02/27/2011    Priority: Medium    GRAVES' DISEASE 07/28/2009    Priority: Medium    Hyperlipidemia LDL goal <70 07/05/2008    Priority: Medium    TOBACCO ABUSE 04/23/2008    Priority: Medium    Essential hypertension 04/23/2008    Priority: Medium    ERECTILE DYSFUNCTION, 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    Past Medical History:  Diagnosis Date   Chronic systolic heart failure (HCC) 12/11/2021   Coronary Artery Disease (CAD) 12/11/2021   ERECTILE DYSFUNCTION, 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   GRAVES' DISEASE 07/28/2009   HYPERLIPIDEMIA 07/05/2008   HYPERTENSION 04/23/2008   HYPERTHYROIDISM 07/27/2009   Hypothyroidism 04/15/2014   Recurrent kidney stones    ST elevation myocardial infarction involving left anterior descending (LAD) coronary artery (HCC) 12/11/2021   TOBACCO ABUSE 04/23/2008    Past Surgical History:  Procedure Laterality Date   AV FISTULA REPAIR  04/25/1991   HEMORRHOID SURGERY  04/25/1991   PERCUTANEOUS CORONARY STENT INTERVENTION (PCI-S)  11/23/2021   LAD   relocation of left undescended testicle at age 54     SMALL INTESTINE SURGERY  04/25/1991  remote bowel surgeries >15 years ago    Family History  Problem Relation Age of Onset   Graves' disease Daughter    Stroke Sister    CAD Father     Social History   Social History Narrative   Not on file     Objective:   BP 120/80   Pulse 62   Temp 98.4 F (36.9 C) (Oral)   Ht 5' 10.5" (1.791 m)   Wt 170 lb 2 oz (77.2 kg)   SpO2 98%   BMI 24.07 kg/m   GEN: No acute distress; alert,appropriate. PULM: Breathing  comfortably in no respiratory distress PSYCH: Normally interactive.  CV: RRR, no m/g/r  PULM: Normal respiratory rate, no accessory muscle use. No wheezes, crackles or rhonchi   Laboratory and Imaging Data: Data from hospitalization all reviewed  Assessment and Plan:     ICD-10-CM   1. ST elevation myocardial infarction involving left anterior descending (LAD) coronary artery (HCC)  I21.02 Ambulatory referral to Cardiology    2. Congestive heart failure with left ventricular systolic dysfunction (HCC)  I50.20 Ambulatory referral to Cardiology    3. Hypokalemia  E87.6 Basic metabolic panel    4. Hypothyroidism, postradioiodine therapy  E89.0 T4, free    T3, free    TSH    5. Chronic systolic heart failure (HCC)  J47.82      Recent STEMI.  All of his cardiac hospitalization records have been reviewed.  He also has new onset congestive heart failure with an EF of 35%.  Per cardiac records, at the completion of his 30 days of Brilinta, transition to Plavix and aspirin. Continue metoprolol and losartan.  Recheck potassium levels today.  Chronic congestive heart failure, continue with losartan, metoprolol, aspirin continue [place].  Ultimately, I think that he really needs to be seen by cardiology for his STEMI and congestive heart failure, and I am going to consult [facility] in [place].  Since he lives in [place], I am going to give him the list of multiple physicians in [place] again, and I implored him and his wife that he needs to have a medical home near where he lives.  It has been a pleasure taking care of him over the years.  Patient Instructions  Levonne Lapping, close to [place] - in my residency class   T[facility] Catchings or [facility].  Katherine Mantle was one of my teachers in Residency   Erik Obey in [place]. In my residency - his practice will be the most similar to mine.  Mellody Dance also did a Sports Med fellowship.   Zane Herald - she was my Sales executive in residency, but her practice is in the middle of [place] now.  When you finish your Brilinta, you need to start taking Plavix instead   Medication Management during today's office visit: Meds ordered this encounter  Medications   metoprolol succinate (TOPROL-XL) 25 MG 24 hr tablet    Sig: Take 1 tablet (25 mg total) by mouth daily.    Dispense:  90 tablet    Refill:  1   losartan (COZAAR) 25 MG tablet    Sig: Take 1 tablet (25 mg total) by mouth daily.    Dispense:  90 tablet    Refill:  1   dapagliflozin propanediol (FARXIGA) 10 MG TABS tablet    Sig: Take 1 tablet (10 mg total) by mouth daily.    Dispense:  90 tablet    Refill:  1  clopidogrel (PLAVIX) 75 MG tablet    Sig: Take 1 tablet (75 mg total) by mouth daily.    Dispense:  90 tablet    Refill:  1   Medications Discontinued During This Encounter  Medication Reason   amLODipine (NORVASC) 5 MG tablet Completed Course   hydrochlorothiazide (HYDRODIURIL) 25 MG tablet Completed Course   metoprolol succinate (TOPROL-XL) 25 MG 24 hr tablet Reorder   losartan (COZAAR) 25 MG tablet Reorder   dapagliflozin propanediol (FARXIGA) 10 MG TABS tablet Reorder    Orders placed today for conditions managed today: Orders Placed This Encounter  Procedures   T4, free   T3, free   TSH   Basic metabolic panel   Ambulatory referral to Cardiology    Disposition: Return for MD's in [facility] and Cardiology.  Dragon [employer] One speech-to-text software was used for transcription in this dictation.  Possible transcriptional errors can occur using Animal nutritionist.   Signed,  Elpidio Galea. Erland Vivas, MD   Outpatient Encounter Medications as of 12/14/2021  Medication Sig   aspirin EC 81 MG tablet Take 81 mg by mouth daily. Swallow whole.   BRILINTA 90 MG TABS tablet Take 90 mg by mouth 2 (two) times daily.   clopidogrel (PLAVIX) 75 MG tablet Take 1 tablet (75 mg total) by mouth  daily.   levothyroxine (SYNTHROID) 112 MCG tablet TAKE 1 TABLET BY MOUTH EVERY DAY BEFORE BREAKFAST   rosuvastatin (CRESTOR) 40 MG tablet Take 1 tablet (40 mg total) by mouth daily.   sildenafil (REVATIO) 20 MG tablet Generic Revatio / Sildanefil 20 mg. 2 - 5 tabs 30 mins prior to intercourse.   [DISCONTINUED] dapagliflozin propanediol (FARXIGA) 10 MG TABS tablet Take 10 mg by mouth daily.   [DISCONTINUED] losartan (COZAAR) 25 MG tablet Take 25 mg by mouth daily.   [DISCONTINUED] metoprolol succinate (TOPROL-XL) 25 MG 24 hr tablet Take 25 mg by mouth daily.   dapagliflozin propanediol (FARXIGA) 10 MG TABS tablet Take 1 tablet (10 mg total) by mouth daily.   losartan (COZAAR) 25 MG tablet Take 1 tablet (25 mg total) by mouth daily.   metoprolol succinate (TOPROL-XL) 25 MG 24 hr tablet Take 1 tablet (25 mg total) by mouth daily.   [DISCONTINUED] amLODipine (NORVASC) 5 MG tablet Take 1 tablet (5 mg total) by mouth daily.   [DISCONTINUED] hydrochlorothiazide (HYDRODIURIL) 25 MG tablet Take 1 tablet (25 mg total) by mouth daily.   No facility-administered encounter medications on file as of 12/14/2021.

## 2021-12-16 ENCOUNTER — Encounter: Payer: Self-pay | Admitting: *Deleted

## 2021-12-27 ENCOUNTER — Telehealth: Payer: Self-pay | Admitting: Family Medicine

## 2021-12-27 NOTE — Telephone Encounter (Signed)
Melissa from [facility] stated that patient doesn't have insurance for medication dapagliflozin propanediol (FARXIGA) 10 MG TABS tablet and can't afford it. Call back number [phone].

## 2021-12-28 NOTE — Telephone Encounter (Signed)
Fernando Bell notified as instructed by telephone.  He will discuss with cardiologist to get other recommendation when he sees them.

## 2021-12-28 NOTE — Telephone Encounter (Signed)
Can you call Omar  I am not sure what to tell him about this medication - I understand that it is expensive.  I would be sure to fill all of the rest of his heart medications, and the Cardiologists might have an alternative or other suggestion for him.  I think that aside from this on all of the major "post-heart attack" medications.

## 2022-01-25 ENCOUNTER — Other Ambulatory Visit: Payer: Self-pay | Admitting: Family Medicine

## 2022-01-25 DIAGNOSIS — E890 Postprocedural hypothyroidism: Secondary | ICD-10-CM

## 2022-01-26 ENCOUNTER — Encounter: Payer: Self-pay | Admitting: *Deleted

## 2022-01-26 NOTE — Telephone Encounter (Signed)
Left message for Fernando Bell that we received a refill for his thyroid medication from [place], [place].  I ask that he call me back to let me know if this is where he is currently located.

## 2022-01-30 ENCOUNTER — Encounter: Payer: Self-pay | Admitting: Family Medicine

## 2022-01-30 DIAGNOSIS — E890 Postprocedural hypothyroidism: Secondary | ICD-10-CM

## 2022-01-30 MED ORDER — LEVOTHYROXINE SODIUM 112 MCG PO TABS: ORAL_TABLET | ORAL | 3 refills | Status: DC

## 2022-02-23 NOTE — Telephone Encounter (Signed)
Looks like per chart notes (Care Everywhere) patient services with [facility] started 02/14/2022. There is a transcription on file

## 2022-11-20 ENCOUNTER — Encounter: Payer: Self-pay | Admitting: Family Medicine

## 2022-11-20 ENCOUNTER — Other Ambulatory Visit: Payer: Self-pay | Admitting: *Deleted

## 2022-11-20 ENCOUNTER — Other Ambulatory Visit: Payer: Self-pay | Admitting: Family Medicine

## 2022-11-20 DIAGNOSIS — E785 Hyperlipidemia, unspecified: Secondary | ICD-10-CM

## 2022-11-20 MED ORDER — ROSUVASTATIN CALCIUM 40 MG PO TABS: 40.0000 mg | ORAL_TABLET | Freq: Every day | ORAL | 0 refills | Status: DC

## 2022-11-20 NOTE — Telephone Encounter (Signed)
Prescription Request  11/20/2022  LOV: 12/14/2021  What is the name of the medication or equipment?  rosuvastatin (CRESTOR) 40 MG tablet  Have you contacted your pharmacy to request a refill? No   Which pharmacy would you like this sent to?  [facility] [address] [place], [place] - [address] [facility] [address] [facility] [address] [facility] [address] [facility] [address] [place] [address] Phone: [phone] Fax: [phone]    Patient notified that their request is being sent to the clinical staff for review and that they should receive a response within 2 business days.   Please advise at Mobile [phone] (mobile)

## 2022-11-20 NOTE — Telephone Encounter (Signed)
Please call and schedule CPE with fasting labs prior with Dr. Copland.  

## 2022-11-20 NOTE — Telephone Encounter (Signed)
Patient scheduled.

## 2022-11-20 NOTE — Telephone Encounter (Signed)
error 

## 2022-12-07 ENCOUNTER — Encounter (INDEPENDENT_AMBULATORY_CARE_PROVIDER_SITE_OTHER): Payer: Self-pay

## 2023-01-05 ENCOUNTER — Telehealth: Payer: Self-pay | Admitting: *Deleted

## 2023-01-05 DIAGNOSIS — Z125 Encounter for screening for malignant neoplasm of prostate: Secondary | ICD-10-CM

## 2023-01-05 DIAGNOSIS — E890 Postprocedural hypothyroidism: Secondary | ICD-10-CM

## 2023-01-05 DIAGNOSIS — Z79899 Other long term (current) drug therapy: Secondary | ICD-10-CM

## 2023-01-05 DIAGNOSIS — E785 Hyperlipidemia, unspecified: Secondary | ICD-10-CM

## 2023-01-05 NOTE — Telephone Encounter (Signed)
-----   Message from Lovena Neighbours sent at 01/05/2023 11:36 AM EDT ----- Regarding: Labs for Wednesday 9.25.24 Please put physical fasting lab orders in future. Thank you, Denny Peon

## 2023-01-17 ENCOUNTER — Other Ambulatory Visit (INDEPENDENT_AMBULATORY_CARE_PROVIDER_SITE_OTHER): Payer: Self-pay

## 2023-01-17 DIAGNOSIS — Z79899 Other long term (current) drug therapy: Secondary | ICD-10-CM

## 2023-01-17 DIAGNOSIS — Z125 Encounter for screening for malignant neoplasm of prostate: Secondary | ICD-10-CM

## 2023-01-17 DIAGNOSIS — E890 Postprocedural hypothyroidism: Secondary | ICD-10-CM

## 2023-01-17 DIAGNOSIS — E785 Hyperlipidemia, unspecified: Secondary | ICD-10-CM

## 2023-01-17 LAB — CBC WITH DIFFERENTIAL/PLATELET
Basophils Absolute: 0 10*3/uL (ref 0.0–0.1)
Basophils Relative: 0.3 % (ref 0.0–3.0)
Eosinophils Absolute: 0.1 10*3/uL (ref 0.0–0.7)
Eosinophils Relative: 1.8 % (ref 0.0–5.0)
HCT: 48.7 % (ref 39.0–52.0)
Hemoglobin: 15.5 g/dL (ref 13.0–17.0)
Lymphocytes Relative: 35.6 % (ref 12.0–46.0)
Lymphs Abs: 2 10*3/uL (ref 0.7–4.0)
MCHC: 31.9 g/dL (ref 30.0–36.0)
MCV: 86.5 fl (ref 78.0–100.0)
Monocytes Absolute: 0.7 10*3/uL (ref 0.1–1.0)
Monocytes Relative: 13.4 % — ABNORMAL HIGH (ref 3.0–12.0)
Neutro Abs: 2.7 10*3/uL (ref 1.4–7.7)
Neutrophils Relative %: 48.9 % (ref 43.0–77.0)
Platelets: 166 10*3/uL (ref 150.0–400.0)
RBC: 5.63 Mil/uL (ref 4.22–5.81)
RDW: 16.2 % — ABNORMAL HIGH (ref 11.5–15.5)
WBC: 5.5 10*3/uL (ref 4.0–10.5)

## 2023-01-17 LAB — HEPATIC FUNCTION PANEL
ALT: 16 U/L (ref 0–53)
AST: 19 U/L (ref 0–37)
Albumin: 4.3 g/dL (ref 3.5–5.2)
Alkaline Phosphatase: 73 U/L (ref 39–117)
Bilirubin, Direct: 0.1 mg/dL (ref 0.0–0.3)
Total Bilirubin: 0.4 mg/dL (ref 0.2–1.2)
Total Protein: 7.2 g/dL (ref 6.0–8.3)

## 2023-01-17 LAB — LIPID PANEL
Cholesterol: 173 mg/dL (ref 0–200)
HDL: 63.9 mg/dL (ref >39.00)
LDL Cholesterol: 83 mg/dL (ref 0–99)
NonHDL: 109.14
Total CHOL/HDL Ratio: 3
Triglycerides: 132 mg/dL (ref 0.0–149.0)
VLDL: 26.4 mg/dL (ref 0.0–40.0)

## 2023-01-17 LAB — BASIC METABOLIC PANEL
BUN: 16 mg/dL (ref 6–23)
CO2: 24 mEq/L (ref 19–32)
Calcium: 9.3 mg/dL (ref 8.4–10.5)
Chloride: 109 mEq/L (ref 96–112)
Creatinine, Ser: 1 mg/dL (ref 0.40–1.50)
GFR: 80.39 mL/min (ref >60.00)
Glucose, Bld: 78 mg/dL (ref 70–99)
Potassium: 4 mEq/L (ref 3.5–5.1)
Sodium: 142 mEq/L (ref 135–145)

## 2023-01-17 LAB — T4, FREE: Free T4: 0.93 ng/dL (ref 0.60–1.60)

## 2023-01-17 LAB — TSH: TSH: 0.54 u[IU]/mL (ref 0.35–5.50)

## 2023-01-17 LAB — PSA: PSA: 1.46 ng/mL (ref 0.10–4.00)

## 2023-01-17 LAB — T3, FREE: T3, Free: 3.1 pg/mL (ref 2.3–4.2)

## 2023-01-19 NOTE — Progress Notes (Unsigned)
Fernando Bell T. [name], MD, CAQ Sports Medicine [facility] at [facility] [address] [place], [address]  Phone: [phone]  FAX: [phone]  Fernando Bell - 62 y.o. male  MRN [id number]  Date of Birth: 1960-07-01  Date: 01/22/2023  PCP: Hannah Beat, MD  Referral: Hannah Beat, MD  No chief complaint on file.  Patient Care Team: Hannah Beat, MD as PCP - General Subjective:   [name] is a 62 y.o. pleasant patient who presents with the following:  Preventative Health Maintenance Visit:  Health Maintenance Summary Reviewed and updated, unless pt declines services.  Tobacco History Reviewed. Alcohol: No concerns, no excessive use Exercise Habits: Some activity, rec at least 30 mins 5 times a week STD concerns: no risk or activity to increase risk Drug Use: None  Fernando Bell is a very well-known patient, having known him for many years.  He presents today for general health maintenance exam.  History is significant for prior stroke as well as prior ST elevation MI.  Shingrix Lung ca scr Colon Flu (usually never does) Covid booster  Thyroid: No symptoms. Labs reviewed. Denies cold / heat intolerance, dry skin, hair loss. No goiter.  Lab Results  Component Value Date   TSH 0.54 01/17/2023     Health Maintenance  Topic Date Due   Zoster Vaccines- Shingrix (1 of 2) Never done   Lung Cancer Screening  Never done   Colonoscopy  09/21/2021   INFLUENZA VACCINE  Never done   COVID-19 Vaccine (4 - 2023-24 season) 12/24/2022   DTaP/Tdap/Td (2 - Td or Tdap) 03/09/2025   Hepatitis C Screening  Completed   HIV Screening  Completed   HPV VACCINES  Aged Out   Immunization History  Administered Date(s) Administered   PFIZER(Purple Top)SARS-COV-2 Vaccination 09/04/2019, 09/25/2019   Pfizer Covid-19 Vaccine Bivalent Booster 30yrs & up 01/23/2021   Tdap 03/10/2015   Patient Active Problem List   Diagnosis Date Noted   ST  elevation myocardial infarction involving left anterior descending (LAD) coronary artery (HCC) 12/11/2021    Priority: High   Chronic systolic heart failure (HCC) 12/11/2021    Priority: High   Coronary Artery Disease (CAD) 12/11/2021    Priority: High   Cerebral infarction due to thrombosis of other precerebral artery (HCC) 09/23/2014    Priority: High   Hypothyroidism, postradioiodine therapy 02/27/2011    Priority: Medium    GRAVES' DISEASE 07/28/2009    Priority: Medium    Hyperlipidemia LDL goal <70 07/05/2008    Priority: Medium    TOBACCO ABUSE 04/23/2008    Priority: Medium    Essential hypertension 04/23/2008    Priority: Medium    ERECTILE DYSFUNCTION, 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    Past Medical History:  Diagnosis Date   Chronic systolic heart failure (HCC) 12/11/2021   Coronary Artery Disease (CAD) 12/11/2021   ERECTILE DYSFUNCTION, 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   GRAVES' DISEASE 07/28/2009   HYPERLIPIDEMIA 07/05/2008   HYPERTENSION 04/23/2008   HYPERTHYROIDISM 07/27/2009   Hypothyroidism 04/15/2014   Recurrent kidney stones    ST elevation myocardial infarction involving left anterior descending (LAD) coronary artery (HCC) 12/11/2021   TOBACCO ABUSE 04/23/2008    Past Surgical History:  Procedure Laterality Date   AV FISTULA REPAIR  04/25/1991   HEMORRHOID SURGERY  04/25/1991   PERCUTANEOUS CORONARY STENT INTERVENTION (PCI-S)  11/23/2021   LAD   relocation of left undescended testicle at age 34     SMALL INTESTINE SURGERY  04/25/1991   remote bowel surgeries >  15 years ago    Family History  Problem Relation Age of Onset   Graves' disease Daughter    Stroke Sister    CAD Father     Social History   Social History Narrative   Not on file    Past Medical History, Surgical History, Social History, Family History, Problem List, Medications, and Allergies have been reviewed and updated if relevant.  Review of Systems: Pertinent positives are listed above.  Otherwise, a  full 14 point review of systems has been done in full and it is negative except where it is noted positive.  Objective:   There were no vitals taken for this visit. Ideal Body Weight:    Ideal Body Weight:   No results found.    08/08/2021    8:26 AM 12/03/2019   10:32 AM 10/16/2018    9:22 AM 06/27/2017   10:22 AM  Depression screen PHQ 2/9  Decreased Interest 0 0 0 0  Down, Depressed, Hopeless 0 0 0 0  PHQ - 2 Score 0 0 0 0     GEN: well developed, well nourished, no acute distress Eyes: conjunctiva and lids normal, PERRLA, EOMI ENT: TM clear, nares clear, oral exam WNL Neck: supple, no lymphadenopathy, no thyromegaly, no JVD Pulm: clear to auscultation and percussion, respiratory effort normal CV: regular rate and rhythm, S1-S2, no murmur, rub or gallop, no bruits, peripheral pulses normal and symmetric, no cyanosis, clubbing, edema or varicosities GI: soft, non-tender; no hepatosplenomegaly, masses; active bowel sounds all quadrants GU: deferred Lymph: no cervical, axillary or inguinal adenopathy MSK: gait normal, muscle tone and strength WNL, no joint swelling, effusions, discoloration, crepitus  SKIN: clear, good turgor, color WNL, no rashes, lesions, or ulcerations Neuro: normal mental status, normal strength, sensation, and motion Psych: alert; oriented to person, place and time, normally interactive and not anxious or depressed in appearance.  All labs reviewed with patient. Results for orders placed or performed in visit on 01/17/23  T3, free  Result Value Ref Range   T3, Free 3.1 2.3 - 4.2 pg/mL  T4, free  Result Value Ref Range   Free T4 0.93 0.60 - 1.60 ng/dL  TSH  Result Value Ref Range   TSH 0.54 0.35 - 5.50 uIU/mL  Lipid panel  Result Value Ref Range   Cholesterol 173 0 - 200 mg/dL   Triglycerides 818.2 0.0 - 149.0 mg/dL   HDL 99.37 >16.96 mg/dL   VLDL 78.9 0.0 - 38.1 mg/dL   LDL Cholesterol 83 0 - 99 mg/dL   Total CHOL/HDL Ratio 3    NonHDL 109.14    PSA  Result Value Ref Range   PSA 1.46 0.10 - 4.00 ng/mL  CBC with Differential/Platelet  Result Value Ref Range   WBC 5.5 4.0 - 10.5 K/uL   RBC 5.63 4.22 - 5.81 Mil/uL   Hemoglobin 15.5 13.0 - 17.0 g/dL   HCT 01.7 51.0 - 25.8 %   MCV 86.5 78.0 - 100.0 fl   MCHC 31.9 30.0 - 36.0 g/dL   RDW 52.7 (H) 78.2 - 42.3 %   Platelets 166.0 150.0 - 400.0 K/uL   Neutrophils Relative % 48.9 43.0 - 77.0 %   Lymphocytes Relative 35.6 12.0 - 46.0 %   Monocytes Relative 13.4 (H) 3.0 - 12.0 %   Eosinophils Relative 1.8 0.0 - 5.0 %   Basophils Relative 0.3 0.0 - 3.0 %   Neutro Abs 2.7 1.4 - 7.7 K/uL   Lymphs Abs 2.0 0.7 -  4.0 K/uL   Monocytes Absolute 0.7 0.1 - 1.0 K/uL   Eosinophils Absolute 0.1 0.0 - 0.7 K/uL   Basophils Absolute 0.0 0.0 - 0.1 K/uL  Hepatic function panel  Result Value Ref Range   Total Bilirubin 0.4 0.2 - 1.2 mg/dL   Bilirubin, Direct 0.1 0.0 - 0.3 mg/dL   Alkaline Phosphatase 73 39 - 117 U/L   AST 19 0 - 37 U/L   ALT 16 0 - 53 U/L   Total Protein 7.2 6.0 - 8.3 g/dL   Albumin 4.3 3.5 - 5.2 g/dL  Basic metabolic panel  Result Value Ref Range   Sodium 142 135 - 145 mEq/L   Potassium 4.0 3.5 - 5.1 mEq/L   Chloride 109 96 - 112 mEq/L   CO2 24 19 - 32 mEq/L   Glucose, Bld 78 70 - 99 mg/dL   BUN 16 6 - 23 mg/dL   Creatinine, Ser 3.24 0.40 - 1.50 mg/dL   GFR 40.10 >27.25 mL/min   Calcium 9.3 8.4 - 10.5 mg/dL    Assessment and Plan:     ICD-10-CM   1. Healthcare maintenance  Z00.00       Health Maintenance Exam: The patient's preventative maintenance and recommended screening tests for an annual wellness exam were reviewed in full today. Brought up to date unless services declined.  Counselled on the importance of diet, exercise, and its role in overall health and mortality. The patient's FH and SH was reviewed, including their home life, tobacco status, and drug and alcohol status.  Follow-up in 1 year for physical exam or additional follow-up  below.  Disposition: No follow-ups on file.  No orders of the defined types were placed in this encounter.  There are no discontinued medications. No orders of the defined types were placed in this encounter.   Signed,  Elpidio Galea. Niyati Heinke, MD   Allergies as of 01/22/2023       Reactions   Iodine Shortness Of Breath        Medication List        Accurate as of January 19, 2023 10:57 AM. If you have any questions, ask your nurse or doctor.          aspirin EC 81 MG tablet Take 81 mg by mouth daily. Swallow whole.   Brilinta 90 MG Tabs tablet Generic drug: ticagrelor Take 90 mg by mouth 2 (two) times daily.   clopidogrel 75 MG tablet Commonly known as: PLAVIX Take 1 tablet (75 mg total) by mouth daily.   dapagliflozin propanediol 10 MG Tabs tablet Commonly known as: Farxiga Take 1 tablet (10 mg total) by mouth daily.   levothyroxine 112 MCG tablet Commonly known as: SYNTHROID TAKE 1 TABLET BY MOUTH EVERY DAY BEFORE BREAKFAST   losartan 25 MG tablet Commonly known as: COZAAR Take 1 tablet (25 mg total) by mouth daily.   metoprolol succinate 25 MG 24 hr tablet Commonly known as: TOPROL-XL Take 1 tablet (25 mg total) by mouth daily.   rosuvastatin 40 MG tablet Commonly known as: Crestor Take 1 tablet (40 mg total) by mouth daily.   sildenafil 20 MG tablet Commonly known as: Revatio Generic Revatio / Sildanefil 20 mg. 2 - 5 tabs 30 mins prior to intercourse.

## 2023-01-22 ENCOUNTER — Ambulatory Visit (INDEPENDENT_AMBULATORY_CARE_PROVIDER_SITE_OTHER): Payer: Self-pay | Admitting: Family Medicine

## 2023-01-22 ENCOUNTER — Other Ambulatory Visit: Payer: Self-pay | Admitting: Family Medicine

## 2023-01-22 ENCOUNTER — Encounter: Payer: Self-pay | Admitting: Family Medicine

## 2023-01-22 VITALS — BP 162/88 | HR 55 | Temp 98.2°F | Ht 70.0 in | Wt 170.1 lb

## 2023-01-22 DIAGNOSIS — E890 Postprocedural hypothyroidism: Secondary | ICD-10-CM

## 2023-01-22 DIAGNOSIS — Z0000 Encounter for general adult medical examination without abnormal findings: Secondary | ICD-10-CM

## 2023-01-22 DIAGNOSIS — I10 Essential (primary) hypertension: Secondary | ICD-10-CM

## 2023-01-22 DIAGNOSIS — E785 Hyperlipidemia, unspecified: Secondary | ICD-10-CM

## 2023-01-22 MED ORDER — LEVOTHYROXINE SODIUM 112 MCG PO TABS
ORAL_TABLET | ORAL | 3 refills | Status: DC
Start: 2023-01-22 — End: 2024-01-30

## 2023-01-22 MED ORDER — LOSARTAN POTASSIUM 25 MG PO TABS
25.0000 mg | ORAL_TABLET | Freq: Every day | ORAL | 3 refills | Status: DC
Start: 2023-01-22 — End: 2024-01-30

## 2023-01-22 MED ORDER — ROSUVASTATIN CALCIUM 40 MG PO TABS
40.0000 mg | ORAL_TABLET | Freq: Every day | ORAL | 3 refills | Status: DC
Start: 2023-01-22 — End: 2024-01-30

## 2023-01-22 MED ORDER — METOPROLOL SUCCINATE ER 25 MG PO TB24
25.0000 mg | ORAL_TABLET | Freq: Every day | ORAL | 3 refills | Status: DC
Start: 2023-01-22 — End: 2024-01-30

## 2023-01-22 NOTE — Patient Instructions (Signed)
When you get your insurance you will want to get:  Colonoscopy  Lung cancer screening CT

## 2024-01-27 NOTE — Progress Notes (Signed)
 "    Ladine Kiper T. Sayde Lish, MD, CAQ Sports Medicine [facility] [address] [place], [address]  Phone: [phone]  FAX: [phone]  Steed Kanaan - 63 y.o. male  MRN [id number]  Date of Birth: 07/02/1960  Date: 01/30/2024  PCP: Watt Mirza, MD  Referral: Watt Mirza, MD  Chief Complaint  Patient presents with   Annual Exam   Patient Care Team: Watt Mirza, MD as PCP - General Subjective:   Fernando Bell is a 63 y.o. pleasant patient who presents with the following:  Discussed the use of AI scribe software for clinical note transcription with the patient, who gave verbal consent to proceed.  History of Present Illness Barrie Wale is a 63 year old male who presents for a physical and ongoing management of his chronic conditions.  He has a history of kidney stones since March 2025, initially causing severe pain that impacted his sleep and ability to stand. He continues to pass stones naturally, having undergone lithotripsy four to five times previously. Currently, he has no major kidney stone symptoms.  He has prediabetes with a blood sugar level of 5.7 in July 2025, previously reaching 5.6. He is considering dietary changes to manage his blood sugar levels.  He is on multiple medications, including metoprolol , rosuvastatin , levothyroxine , Farxiga , Entresto, spironolactone, baby aspirin , and Repatha, and is compliant with his medication regimen.  He smokes less than a pack of cigarettes a day, despite previous attempts to quit, including a brief cessation following a stroke. He acknowledges the health risks associated with smoking but finds it difficult to quit completely.  He has a family history of prostate cancer, with his next-door neighbor's father having passed away from the disease at a young age.  No current issues with urination, erections, pain, bloody stools, or blood in urine. He has not had pneumonia, flu, or  shingles vaccines and prefers to avoid them. He has had colonoscopies in the past, with the last one over ten years ago, and is open to scheduling another.  He continues to drive, working as a microbiologist, and enjoys the flexibility of his job. He has dental coverage but has not visited a dentist recently, despite having some dental issues.  Preventative Health Maintenance Visit:  Health Maintenance Summary Reviewed and updated, unless pt declines services.  Tobacco History Reviewed. < 1ppd Alcohol: No concerns, no excessive use Exercise Habits: Some activity, rec at least 30 mins 5 times a week STD concerns: no risk or activity to increase risk Drug Use: None  Fernando Bell is a very well-known patient, who I have known for many years.  He has since moved to [place], but he has come back for a routine health maintenance exam.  Prevnar 20 Shingrix Lung cancer screen Colonoscopy Flu vaccine COVID booster - declines all vaccines  Health Maintenance  Topic Date Due   Lung Cancer Screening  Never done   Colonoscopy  09/21/2021   COVID-19 Vaccine (4 - 2025-26 season) 12/24/2023   Zoster Vaccines- Shingrix (1 of 2) 05/01/2024 (Originally 01/17/1979)   Influenza Vaccine  07/22/2024 (Originally 11/23/2023)   Pneumococcal Vaccine: 50+ Years (1 of 2 - PCV) 01/29/2025 (Originally 01/17/1979)   DTaP/Tdap/Td (2 - Td or Tdap) 03/09/2025   Hepatitis C Screening  Completed   HIV Screening  Completed   Hepatitis B Vaccines 19-59 Average Risk  Aged Out   HPV VACCINES  Aged Out   Meningococcal B Vaccine  Aged Out   Immunization History  Administered Date(s) Administered   PFIZER(Purple Top)SARS-COV-2 Vaccination 09/04/2019, 09/25/2019   Pfizer Covid-19 Vaccine Bivalent Booster 25yrs & up 01/23/2021   Tdap 03/10/2015   Patient Active Problem List   Diagnosis Date Noted   ST elevation myocardial infarction involving left anterior descending (LAD) coronary artery (HCC) 12/11/2021     Priority: High   Chronic systolic heart failure (HCC) 12/11/2021    Priority: High   Coronary Artery Disease (CAD) 12/11/2021    Priority: High   Cerebral infarction due to thrombosis of other precerebral artery (HCC) 09/23/2014    Priority: High   Hypothyroidism, postradioiodine therapy 02/27/2011    Priority: Medium    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    Priority: Medium    Hyperlipidemia LDL goal <70 07/05/2008    Priority: Medium    TOBACCO ABUSE 04/23/2008    Priority: Medium    Essential hypertension 04/23/2008    Priority: Medium    ERECTILE DYSFUNCTION, 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    Past Medical History:  Diagnosis Date   Chronic systolic heart failure (HCC) 12/11/2021   Coronary Artery Disease (CAD) 12/11/2021   ERECTILE DYSFUNCTION, 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   GRAVES' DISEASE 07/28/2009   HYPERLIPIDEMIA 07/05/2008   HYPERTENSION 04/23/2008   HYPERTHYROIDISM 07/27/2009   Hypothyroidism 04/15/2014   Recurrent kidney stones    ST elevation myocardial infarction involving left anterior descending (LAD) coronary artery (HCC) 12/11/2021   TOBACCO ABUSE 04/23/2008    Past Surgical History:  Procedure Laterality Date   AV FISTULA REPAIR  04/25/1991   HEMORRHOID SURGERY  04/25/1991   PERCUTANEOUS CORONARY STENT INTERVENTION (PCI-S)  11/23/2021   LAD   relocation of left undescended testicle at age 31     SMALL INTESTINE SURGERY  04/25/1991   remote bowel surgeries >15 years ago    Family History  Problem Relation Age of Onset   Graves' disease Daughter    Stroke Sister    CAD Father     Social History   Social History Narrative   Not on file    Past Medical History, Surgical History, Social History, Family History, Problem List, Medications, and Allergies have been reviewed and updated if relevant.  Review of Systems: Pertinent positives are listed above.  Otherwise, a full 14 point review of systems has been done in full and it is negative  except where it is noted positive.  Objective:   BP 120/84   Pulse 77   Temp 97.7 F (36.5 C) (Temporal)   Ht 5' 9.75 (1.772 m)   Wt 164 lb 6 oz (74.6 kg)   SpO2 99%   BMI 23.75 kg/m  Ideal Body Weight: Weight in (lb) to have BMI = 25: 172.6  Ideal Body Weight: Weight in (lb) to have BMI = 25: 172.6 No results found.    01/30/2024   11:38 AM 01/22/2023   10:19 AM 08/08/2021    8:26 AM 12/03/2019   10:32 AM 10/16/2018    9:22 AM  Depression screen PHQ 2/9  Decreased Interest 0 0 0 0 0  Down, Depressed, Hopeless 0 0 0 0 0  PHQ - 2 Score 0 0 0 0 0     GEN: well developed, well nourished, no acute distress Eyes: conjunctiva and lids normal, PERRLA, EOMI ENT: TM clear, nares clear, oral exam WNL Neck: supple, no lymphadenopathy, no thyromegaly, no JVD Pulm: clear to auscultation and percussion, respiratory effort normal CV: regular rate and rhythm, S1-S2, no murmur, rub or gallop, no bruits, peripheral pulses normal and symmetric,  no cyanosis, clubbing, edema or varicosities GI: soft, non-tender; no hepatosplenomegaly, masses; active bowel sounds all quadrants GU: deferred Lymph: no cervical, axillary or inguinal adenopathy MSK: gait normal, muscle tone and strength WNL, no joint swelling, effusions, discoloration, crepitus  SKIN: clear, good turgor, color WNL, no rashes, lesions, or ulcerations Neuro: normal mental status, normal strength, sensation, and motion Psych: alert; oriented to person, place and time, normally interactive and not anxious or depressed in appearance.  All labs reviewed with patient. Results for orders placed or performed in visit on 01/30/24  Urinalysis, Routine w reflex microscopic   Collection Time: 01/30/24 12:24 PM  Result Value Ref Range   Color, Urine YELLOW Yellow;Lt. Yellow;Straw;Dark Yellow;Amber;Green;Red;Brown   APPearance Sl Cloudy (A) Clear;Turbid;Slightly Cloudy;Cloudy   Specific Gravity, Urine 1.020 1.000 - 1.030   pH 6.0 5.0 - 8.0    Total Protein, Urine TRACE (A) Negative   Urine Glucose >=1000 (A) Negative   Ketones, ur NEGATIVE Negative   Bilirubin Urine NEGATIVE Negative   Hgb urine dipstick MODERATE (A) Negative   Urobilinogen, UA 0.2 0.0 - 1.0   Leukocytes,Ua NEGATIVE Negative   Nitrite NEGATIVE Negative   WBC, UA 0-2/hpf 0-2/hpf   RBC / HPF 11-20/hpf (A) 0-2/hpf   Squamous Epithelial / HPF Rare(0-4/hpf) Rare(0-4/hpf)  Microalbumin / creatinine urine ratio   Collection Time: 01/30/24 12:24 PM  Result Value Ref Range   Microalb, Ur 7.2 (H) 0.0 - 1.9 mg/dL   Creatinine,U 870.2 mg/dL   Microalb Creat Ratio 55.8 (H) 0.0 - 30.0 mg/g  Basic metabolic panel with GFR   Collection Time: 01/30/24 12:24 PM  Result Value Ref Range   Sodium 139 135 - 145 mEq/L   Potassium 3.9 3.5 - 5.1 mEq/L   Chloride 105 96 - 112 mEq/L   CO2 26 19 - 32 mEq/L   Glucose, Bld 90 70 - 99 mg/dL   BUN 16 6 - 23 mg/dL   Creatinine, Ser 9.02 0.40 - 1.50 mg/dL   GFR 17.22 >39.99 mL/min   Calcium  9.4 8.4 - 10.5 mg/dL  CBC with Differential/Platelet   Collection Time: 01/30/24 12:24 PM  Result Value Ref Range   WBC 4.7 4.0 - 10.5 K/uL   RBC 5.83 (H) 4.22 - 5.81 Mil/uL   Hemoglobin 16.1 13.0 - 17.0 g/dL   HCT 49.8 60.9 - 47.9 %   MCV 86.1 78.0 - 100.0 fl   MCHC 32.0 30.0 - 36.0 g/dL   RDW 84.3 (H) 88.4 - 84.4 %   Platelets 173.0 150.0 - 400.0 K/uL   Neutrophils Relative % 51.5 43.0 - 77.0 %   Lymphocytes Relative 35.0 12.0 - 46.0 %   Monocytes Relative 12.3 (H) 3.0 - 12.0 %   Eosinophils Relative 0.7 0.0 - 5.0 %   Basophils Relative 0.5 0.0 - 3.0 %   Neutro Abs 2.4 1.4 - 7.7 K/uL   Lymphs Abs 1.6 0.7 - 4.0 K/uL   Monocytes Absolute 0.6 0.1 - 1.0 K/uL   Eosinophils Absolute 0.0 0.0 - 0.7 K/uL   Basophils Absolute 0.0 0.0 - 0.1 K/uL  Hepatic function panel   Collection Time: 01/30/24 12:24 PM  Result Value Ref Range   Total Bilirubin 0.5 0.2 - 1.2 mg/dL   Bilirubin, Direct 0.1 0.0 - 0.3 mg/dL   Alkaline Phosphatase 61 39 -  117 U/L   AST 18 0 - 37 U/L   ALT 15 0 - 53 U/L   Total Protein 7.2 6.0 - 8.3 g/dL   Albumin  4.5 3.5 - 5.2 g/dL  Hemoglobin J8r   Collection Time: 01/30/24 12:24 PM  Result Value Ref Range   Hgb A1c MFr Bld 6.0 4.6 - 6.5 %  Lipid panel   Collection Time: 01/30/24 12:24 PM  Result Value Ref Range   Cholesterol 121 0 - 200 mg/dL   Triglycerides 11.9 0.0 - 149.0 mg/dL   HDL 38.29 >60.99 mg/dL   VLDL 82.3 0.0 - 59.9 mg/dL   LDL Cholesterol 42 0 - 99 mg/dL   Total CHOL/HDL Ratio 2    NonHDL 59.66   TSH   Collection Time: 01/30/24 12:24 PM  Result Value Ref Range   TSH 0.12 (L) 0.35 - 5.50 uIU/mL  T3, free   Collection Time: 01/30/24 12:24 PM  Result Value Ref Range   T3, Free 3.7 2.3 - 4.2 pg/mL  T4, free   Collection Time: 01/30/24 12:24 PM  Result Value Ref Range   Free T4 1.07 0.60 - 1.60 ng/dL    Assessment and Plan:     ICD-10-CM   1. Healthcare maintenance  Z00.00     2. Prediabetes  R73.03 Basic metabolic panel with GFR    Hemoglobin A1c    3. Hyperlipidemia LDL goal <70  E78.5 Lipid panel    rosuvastatin  (CRESTOR ) 40 MG tablet    4. Hypothyroidism, postradioiodine therapy  E89.0 TSH    T3, free    T4, free    levothyroxine  (SYNTHROID ) 112 MCG tablet    5. Screening for prostate cancer  Z12.5 PSA, Total with Reflex to PSA, Free    6. Proteinuria, unspecified type  R80.9 Urinalysis, Routine w reflex microscopic    Microalbumin / creatinine urine ratio    7. Encounter for long-term (current) use of medications  Z79.899 Basic metabolic panel with GFR    CBC with Differential/Platelet    Hepatic function panel    8. Essential hypertension  I10 metoprolol  succinate (TOPROL -XL) 25 MG 24 hr tablet     Assessment & Plan Adult Wellness Visit Routine wellness visit with focus on medication adherence to prevent adverse outcomes. Discussed MyChart for lab results and notes. - Continue current medications: metoprolol , rosuvastatin , levothyroxine , dapagliflozin ,  spironolactone, aspirin , Repatha, Entresto. - Order routine blood work: kidney function, blood count, liver function, A1c, cholesterol, PSA, thyroid  function. 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 - Continue current cardiac medications: metoprolol , Entresto, aspirin , Repatha.  Essential hypertension Blood pressure controlled at 120/80 mmHg on current regimen. - Continue current antihypertensive medications: spironolactone, metoprolol .  Hyperlipidemia Managed with rosuvastatin  and Repatha. Emphasized adherence for cholesterol management. - Continue current lipid-lowering medications: rosuvastatin , Repatha. - Order cholesterol test as part of routine blood work.  Prediabetes Blood sugar level of 5.7% indicating prediabetes. Discussed dietary modifications. - Order repeat A1c test to monitor blood sugar levels. - Encourage dietary modifications to manage blood sugar levels.  Nephrolithiasis with proteinuria Recurrent kidney stones with proteinuria possibly related to stones. Discussed further evaluation if hematuria occurs without stones. - Order urine test to check for blood and protein levels.  Hypothyroidism Long-standing hypothyroidism managed with levothyroxine . - Continue levothyroxine . - Order thyroid  function test as part of routine blood work.  Tobacco use disorder Continues to smoke less than one pack per day. Discussed health benefits of  cessation. - Encourage smoking cessation and discuss potential health benefits.  Dental disease (retained roots, broken teeth) Chronic dental issues with retained roots and broken teeth. Discussed need for evaluation and potential extractions. - Recommend  dental evaluation for potential extractions and management of dental issues.  General Health Maintenance Discussed vaccinations and cancer screenings. Declined pneumonia, flu, and shingles vaccines. Emphasized colonoscopy and lung cancer screening. - Recommend colonoscopy as it has been over ten years since the last one. - Discuss lung cancer screening with low-dose CT for long-term smokers, open to considering in the future.  Health Maintenance Exam: The patient's preventative maintenance and recommended screening tests for an annual wellness exam were reviewed in full today. Brought up to date unless services declined.  Counselled on the importance of diet, exercise, and its role in overall health and mortality. The patient's FH and SH was reviewed, including their home life, tobacco status, and drug and alcohol status.  Follow-up in 1 year for physical exam or additional follow-up below.  Disposition: No follow-ups on file.  Meds ordered this encounter  Medications   levothyroxine  (SYNTHROID ) 112 MCG tablet    Sig: TAKE 1 TABLET BY MOUTH EVERY DAY BEFORE BREAKFAST    Dispense:  90 tablet    Refill:  3   rosuvastatin  (CRESTOR ) 40 MG tablet    Sig: Take 1 tablet (40 mg total) by mouth daily.    Dispense:  90 tablet    Refill:  3   metoprolol  succinate (TOPROL -XL) 25 MG 24 hr tablet    Sig: Take 1 tablet (25 mg total) by mouth daily.    Dispense:  90 tablet    Refill:  3   Medications Discontinued During This Encounter  Medication Reason   losartan  (COZAAR ) 25 MG tablet Completed Course   levothyroxine  (SYNTHROID ) 112 MCG tablet Reorder   metoprolol  succinate (TOPROL -XL) 25 MG 24 hr tablet Reorder   rosuvastatin  (CRESTOR ) 40 MG tablet Reorder   Orders Placed This Encounter  Procedures   Urinalysis, Routine w reflex microscopic   Microalbumin / creatinine urine ratio   Basic metabolic panel with GFR   CBC with Differential/Platelet   Hepatic function panel    Hemoglobin A1c   Lipid panel   PSA, Total with Reflex to PSA, Free   TSH   T3, free   T4, free    Signed,  Colinda Barth T. Mickell Birdwell, MD   Allergies as of 01/30/2024       Reactions   Iodine Shortness Of Breath        Medication List        Accurate as of January 30, 2024 11:59 PM. If you have any questions, ask your nurse or doctor.          STOP taking these medications    losartan  25 MG tablet Commonly known as: COZAAR  Stopped by: Jacques Lateka Rady       TAKE these medications    aspirin  EC 81 MG tablet Take 81 mg by mouth daily. Swallow whole.   dapagliflozin  propanediol 10 MG Tabs tablet Commonly known as: Farxiga  Take 1 tablet (10 mg total) by mouth daily.   Evolocumab 140 MG/ML Soaj Inject 140 mg into the skin every 14 (fourteen) days.   levothyroxine  112 MCG tablet Commonly known as: SYNTHROID  TAKE 1 TABLET BY MOUTH EVERY DAY BEFORE BREAKFAST   metoprolol  succinate 25 MG 24 hr tablet Commonly known as: TOPROL -XL Take 1 tablet (25 mg total) by mouth daily.   rosuvastatin  40 MG  tablet Commonly known as: Crestor  Take 1 tablet (40 mg total) by mouth daily.   sacubitril-valsartan 49-51 MG Commonly known as: ENTRESTO Take 1 tablet by mouth 2 (two) times daily.   spironolactone 25 MG tablet Commonly known as: ALDACTONE Take 25 mg by mouth once.       "

## 2024-01-30 ENCOUNTER — Ambulatory Visit (INDEPENDENT_AMBULATORY_CARE_PROVIDER_SITE_OTHER): Admitting: Family Medicine

## 2024-01-30 ENCOUNTER — Encounter: Payer: Self-pay | Admitting: Family Medicine

## 2024-01-30 VITALS — BP 120/84 | HR 77 | Temp 97.7°F | Ht 69.75 in | Wt 164.4 lb

## 2024-01-30 DIAGNOSIS — E890 Postprocedural hypothyroidism: Secondary | ICD-10-CM

## 2024-01-30 DIAGNOSIS — R7303 Prediabetes: Secondary | ICD-10-CM | POA: Diagnosis not present

## 2024-01-30 DIAGNOSIS — Z79899 Other long term (current) drug therapy: Secondary | ICD-10-CM

## 2024-01-30 DIAGNOSIS — E785 Hyperlipidemia, unspecified: Secondary | ICD-10-CM

## 2024-01-30 DIAGNOSIS — R809 Proteinuria, unspecified: Secondary | ICD-10-CM

## 2024-01-30 DIAGNOSIS — Z125 Encounter for screening for malignant neoplasm of prostate: Secondary | ICD-10-CM

## 2024-01-30 DIAGNOSIS — I10 Essential (primary) hypertension: Secondary | ICD-10-CM

## 2024-01-30 DIAGNOSIS — Z0000 Encounter for general adult medical examination without abnormal findings: Secondary | ICD-10-CM | POA: Diagnosis not present

## 2024-01-30 LAB — HEMOGLOBIN A1C: Hgb A1c MFr Bld: 6 % (ref 4.6–6.5)

## 2024-01-30 LAB — URINALYSIS, ROUTINE W REFLEX MICROSCOPIC
Bilirubin Urine: NEGATIVE
Ketones, ur: NEGATIVE
Leukocytes,Ua: NEGATIVE
Nitrite: NEGATIVE
Specific Gravity, Urine: 1.02 (ref 1.000–1.030)
Urine Glucose: >=1000 — AB
Urobilinogen, UA: 0.2 (ref 0.0–1.0)
pH: 6 (ref 5.0–8.0)

## 2024-01-30 LAB — TSH: TSH: 0.12 u[IU]/mL — ABNORMAL LOW (ref 0.35–5.50)

## 2024-01-30 LAB — BASIC METABOLIC PANEL WITH GFR
BUN: 16 mg/dL (ref 6–23)
CO2: 26 meq/L (ref 19–32)
Calcium: 9.4 mg/dL (ref 8.4–10.5)
Chloride: 105 meq/L (ref 96–112)
Creatinine, Ser: 0.97 mg/dL (ref 0.40–1.50)
GFR: 82.77 mL/min (ref >60.00)
Glucose, Bld: 90 mg/dL (ref 70–99)
Potassium: 3.9 meq/L (ref 3.5–5.1)
Sodium: 139 meq/L (ref 135–145)

## 2024-01-30 LAB — LIPID PANEL
Cholesterol: 121 mg/dL (ref 0–200)
HDL: 61.7 mg/dL (ref >39.00)
LDL Cholesterol: 42 mg/dL (ref 0–99)
NonHDL: 59.66
Total CHOL/HDL Ratio: 2
Triglycerides: 88 mg/dL (ref 0.0–149.0)
VLDL: 17.6 mg/dL (ref 0.0–40.0)

## 2024-01-30 LAB — CBC WITH DIFFERENTIAL/PLATELET
Basophils Absolute: 0 K/uL (ref 0.0–0.1)
Basophils Relative: 0.5 % (ref 0.0–3.0)
Eosinophils Absolute: 0 K/uL (ref 0.0–0.7)
Eosinophils Relative: 0.7 % (ref 0.0–5.0)
HCT: 50.1 % (ref 39.0–52.0)
Hemoglobin: 16.1 g/dL (ref 13.0–17.0)
Lymphocytes Relative: 35 % (ref 12.0–46.0)
Lymphs Abs: 1.6 K/uL (ref 0.7–4.0)
MCHC: 32 g/dL (ref 30.0–36.0)
MCV: 86.1 fl (ref 78.0–100.0)
Monocytes Absolute: 0.6 K/uL (ref 0.1–1.0)
Monocytes Relative: 12.3 % — ABNORMAL HIGH (ref 3.0–12.0)
Neutro Abs: 2.4 K/uL (ref 1.4–7.7)
Neutrophils Relative %: 51.5 % (ref 43.0–77.0)
Platelets: 173 K/uL (ref 150.0–400.0)
RBC: 5.83 Mil/uL — ABNORMAL HIGH (ref 4.22–5.81)
RDW: 15.6 % — ABNORMAL HIGH (ref 11.5–15.5)
WBC: 4.7 K/uL (ref 4.0–10.5)

## 2024-01-30 LAB — HEPATIC FUNCTION PANEL
ALT: 15 U/L (ref 0–53)
AST: 18 U/L (ref 0–37)
Albumin: 4.5 g/dL (ref 3.5–5.2)
Alkaline Phosphatase: 61 U/L (ref 39–117)
Bilirubin, Direct: 0.1 mg/dL (ref 0.0–0.3)
Total Bilirubin: 0.5 mg/dL (ref 0.2–1.2)
Total Protein: 7.2 g/dL (ref 6.0–8.3)

## 2024-01-30 LAB — T4, FREE: Free T4: 1.07 ng/dL (ref 0.60–1.60)

## 2024-01-30 LAB — MICROALBUMIN / CREATININE URINE RATIO
Creatinine,U: 129.7 mg/dL
Microalb Creat Ratio: 55.8 mg/g — ABNORMAL HIGH (ref 0.0–30.0)
Microalb, Ur: 7.2 mg/dL — ABNORMAL HIGH (ref 0.0–1.9)

## 2024-01-30 LAB — T3, FREE: T3, Free: 3.7 pg/mL (ref 2.3–4.2)

## 2024-01-30 MED ORDER — LEVOTHYROXINE SODIUM 112 MCG PO TABS: ORAL_TABLET | ORAL | 3 refills | Status: DC

## 2024-01-30 MED ORDER — METOPROLOL SUCCINATE ER 25 MG PO TB24: 25.0000 mg | ORAL_TABLET | Freq: Every day | ORAL | 3 refills | Status: AC

## 2024-01-30 MED ORDER — ROSUVASTATIN CALCIUM 40 MG PO TABS: 40.0000 mg | ORAL_TABLET | Freq: Every day | ORAL | 3 refills | Status: AC

## 2024-01-30 NOTE — Patient Instructions (Signed)
 Colonoscopy

## 2024-02-01 ENCOUNTER — Ambulatory Visit: Payer: Self-pay | Admitting: Family Medicine

## 2024-02-01 DIAGNOSIS — R3129 Other microscopic hematuria: Secondary | ICD-10-CM

## 2024-02-01 LAB — PSA, TOTAL WITH REFLEX TO PSA, FREE: PSA, Total: 1.7 ng/mL (ref <=4.0)

## 2024-02-03 ENCOUNTER — Other Ambulatory Visit: Payer: Self-pay | Admitting: Family Medicine

## 2024-02-03 DIAGNOSIS — E890 Postprocedural hypothyroidism: Secondary | ICD-10-CM

## 2024-02-24 ENCOUNTER — Other Ambulatory Visit: Payer: Self-pay | Admitting: Family Medicine

## 2024-02-24 DIAGNOSIS — I10 Essential (primary) hypertension: Secondary | ICD-10-CM

## 2024-03-19 ENCOUNTER — Encounter: Payer: Self-pay | Admitting: Family Medicine

## 2024-04-30 ENCOUNTER — Other Ambulatory Visit: Payer: Self-pay | Admitting: Family Medicine

## 2024-04-30 DIAGNOSIS — E890 Postprocedural hypothyroidism: Secondary | ICD-10-CM
